# Patient Record
Sex: Male | Born: 1980
Health system: Southern US, Community
[De-identification: ages and names within clinical notes are randomized; demographics above are authoritative.]

## PROBLEM LIST (undated history)

## (undated) DIAGNOSIS — H60339 Swimmer's ear, unspecified ear: Secondary | ICD-10-CM

## (undated) DIAGNOSIS — B079 Viral wart, unspecified: Secondary | ICD-10-CM

## (undated) HISTORY — DX: Swimmer's ear, unspecified ear: H60.339

## (undated) HISTORY — DX: Viral wart, unspecified: B07.9

---

## 2007-11-05 ENCOUNTER — Ambulatory Visit: Payer: Self-pay | Admitting: Internal Medicine

## 2007-11-05 DIAGNOSIS — B079 Viral wart, unspecified: Secondary | ICD-10-CM | POA: Insufficient documentation

## 2010-03-22 ENCOUNTER — Ambulatory Visit: Payer: Self-pay | Admitting: Family Medicine

## 2010-03-22 DIAGNOSIS — H60339 Swimmer's ear, unspecified ear: Secondary | ICD-10-CM | POA: Insufficient documentation

## 2010-08-07 NOTE — Assessment & Plan Note (Signed)
Summary: ? RIGHT EAR INFECTION   Vital Signs:  Patient profile:   30 year old male Height:      75 inches Weight:      203 pounds BMI:     25.46 Temp:     98.4 degrees F oral Pulse rate:   76 / minute Pulse rhythm:   regular BP sitting:   112 / 72  (left arm) Cuff size:   regular  Vitals Entered By: Linde Gillis CMA Duncan Dull) (March 22, 2010 12:18 PM) CC: ? ear infection   History of Present Illness: 31 yo here with ? ear infection. Went swimming over the weekend, next day, right ear was swollen, very painful to touch. Could not wear his helmet at work. No fevers, chills, runny nose, hearing loss, or discharge from his ear.  Current Medications (verified): 1)  Ciprodex 0.3-0.1 % Susp (Ciprofloxacin-Dexamethasone) .... 4 Drops Into Affected Ear Two Times A Day X 7 Days.  Allergies: 1)  ! Penicillin  Past History:  Past Medical History: Last updated: 11/05/2007 Unremarkable  Past Surgical History: Last updated: 11/05/2007 Denies surgical history  Family History: Last updated: 11/05/2007 Parents healthy 1 sister No CAD, DM, HTN No colon cancer or prostate cancer  Social History: Last updated: 11/05/2007 Seperated  ~6/08 New relationship 2 children Occupation: Biochemist, clinical Current Smoker Alcohol use-occ  Risk Factors: Smoking Status: current (11/05/2007)  Review of Systems      See HPI General:  Denies chills and fever. ENT:  Complains of ear discharge and earache; denies decreased hearing, nasal congestion, sinus pressure, and sore throat. Resp:  Denies cough.  Physical Exam  General:  alert and normal appearance.   Ears:  L ear normal, R pinna tender, R tragus tender, and R canal inflamed.   Nose:  External nasal examination shows no deformity or inflammation. Nasal mucosa are pink and moist without lesions or exudates. Mouth:  Oral mucosa and oropharynx without lesions or exudates.  Teeth in good repair. Psych:  Cognition and judgment  appear intact. Alert and cooperative with normal attention span and concentration. No apparent delusions, illusions, hallucinations   Impression & Recommendations:  Problem # 1:  OTITIS EXTERNA, ACUTE, RIGHT (ICD-380.12) Assessment New Ciprodex x 1 week. Advised trying to keep ears dry while swimming. Continue alleve for pain and inflammation. His updated medication list for this problem includes:    Ciprodex 0.3-0.1 % Susp (Ciprofloxacin-dexamethasone) .Marland KitchenMarland KitchenMarland KitchenMarland Kitchen 4 drops into affected ear two times a day x 7 days.  Complete Medication List: 1)  Ciprodex 0.3-0.1 % Susp (Ciprofloxacin-dexamethasone) .... 4 drops into affected ear two times a day x 7 days. Prescriptions: CIPRODEX 0.3-0.1 % SUSP (CIPROFLOXACIN-DEXAMETHASONE) 4 drops into affected ear two times a day x 7 days.  #1 x 0   Entered and Authorized by:   Ruthe Mannan MD   Signed by:   Ruthe Mannan MD on 03/22/2010   Method used:   Electronically to        [facility]  [address] (retail)       [address]       [place], [place]  [address]       Ph: [phone] or [phone]       Fax: [phone]   RxID:   [id number]   Prior Medications (reviewed today): None Current Allergies (reviewed today): ! PENICILLIN

## 2011-01-15 ENCOUNTER — Emergency Department (HOSPITAL_COMMUNITY): Payer: 59

## 2011-01-15 ENCOUNTER — Emergency Department (HOSPITAL_COMMUNITY)
Admission: EM | Admit: 2011-01-15 | Discharge: 2011-01-16 | Disposition: A | Discharge status: Home / Self Care | Payer: 59 | Attending: Emergency Medicine | Admitting: Emergency Medicine

## 2011-01-15 DIAGNOSIS — IMO0002 Reserved for concepts with insufficient information to code with codable children: Secondary | ICD-10-CM | POA: Insufficient documentation

## 2011-01-15 DIAGNOSIS — Y9352 Activity, horseback riding: Secondary | ICD-10-CM | POA: Insufficient documentation

## 2013-02-10 ENCOUNTER — Ambulatory Visit (INDEPENDENT_AMBULATORY_CARE_PROVIDER_SITE_OTHER): Payer: 59 | Admitting: Family Medicine

## 2013-02-10 ENCOUNTER — Encounter: Payer: Self-pay | Admitting: Family Medicine

## 2013-02-10 VITALS — BP 118/90 | HR 65 | Temp 98.2°F | Wt 208.0 lb

## 2013-02-10 DIAGNOSIS — H60339 Swimmer's ear, unspecified ear: Secondary | ICD-10-CM

## 2013-02-10 DIAGNOSIS — H60331 Swimmer's ear, right ear: Secondary | ICD-10-CM

## 2013-02-10 MED ORDER — CIPROFLOXACIN-HYDROCORTISONE 0.2-1 % OT SUSP: 3.0000 [drp] | Freq: Two times a day (BID) | OTIC | Status: DC

## 2013-02-10 NOTE — Assessment & Plan Note (Signed)
Treat with topical antibiotics. Discussed otitis externa prevention.

## 2013-02-10 NOTE — Progress Notes (Signed)
  Subjective:    Patient ID: Greg Wong, male    DOB: 15-May-1981, 32 y.o.   MRN: [id number]  Otalgia  There is pain in the right ear. The current episode started in the past 7 days. The problem has been gradually worsening. There has been no fever (subjective fever two days ago). The pain is moderate. Associated symptoms include coughing, ear discharge, rhinorrhea and a sore throat. Pertinent negatives include no abdominal pain, diarrhea, drainage, headaches, hearing loss or rash. Associated symptoms comments: Nasal congestion. He has tried acetaminophen for the symptoms. The treatment provided mild relief. There is no history of a chronic ear infection, hearing loss or a tympanostomy tube.   Has ben swimming a lot. Recent trip to [place].   Review of Systems  HENT: Positive for ear pain, sore throat, rhinorrhea and ear discharge. Negative for hearing loss.   Respiratory: Positive for cough.   Gastrointestinal: Negative for abdominal pain and diarrhea.  Skin: Negative for rash.  Neurological: Negative for headaches.       Objective:   Physical Exam  Constitutional: Vital signs are normal. He appears well-developed and well-nourished.  Non-toxic appearance. He does not appear ill. No distress.  HENT:  Head: Normocephalic and atraumatic.  Right Ear: Hearing and tympanic membrane normal. No tenderness. No foreign bodies. Tympanic membrane is not retracted and not bulging.  Left Ear: Hearing, tympanic membrane, external ear and ear canal normal. No tenderness. No foreign bodies. Tympanic membrane is not retracted and not bulging.  Nose: Nose normal. No mucosal edema or rhinorrhea. Right sinus exhibits no maxillary sinus tenderness and no frontal sinus tenderness. Left sinus exhibits no maxillary sinus tenderness and no frontal sinus tenderness.  Mouth/Throat: Uvula is midline, oropharynx is clear and moist and mucous membranes are normal. Normal dentition. No dental caries. No  oropharyngeal exudate or tonsillar abscesses.  Right ext ear canal with redness and swelling. Pain with movement of pinna.  Eyes: Conjunctivae, EOM and lids are normal. Pupils are equal, round, and reactive to light. No foreign bodies found.  Neck: Trachea normal, normal range of motion and phonation normal. Neck supple. Carotid bruit is not present. No mass and no thyromegaly present.  Cardiovascular: Normal rate, regular rhythm, S1 normal, S2 normal, normal heart sounds, intact distal pulses and normal pulses.  Exam reveals no gallop.   No murmur heard. Pulmonary/Chest: Effort normal and breath sounds normal. No respiratory distress. He has no wheezes. He has no rhonchi. He has no rales.  Abdominal: Soft. Normal appearance and bowel sounds are normal. There is no hepatosplenomegaly. There is no tenderness. There is no rebound, no guarding and no CVA tenderness. No hernia.  Neurological: He is alert. He has normal reflexes.  Skin: Skin is warm, dry and intact. No rash noted.  Psychiatric: He has a normal mood and affect. His speech is normal and behavior is normal. Judgment normal.          Assessment & Plan:

## 2013-02-10 NOTE — Patient Instructions (Addendum)
Topical antibiotics drops  Twice daily in right ear x 7 days.

## 2013-04-09 ENCOUNTER — Telehealth: Payer: Self-pay | Admitting: Internal Medicine

## 2013-04-09 ENCOUNTER — Encounter: Payer: Self-pay | Admitting: Internal Medicine

## 2013-04-09 ENCOUNTER — Ambulatory Visit (INDEPENDENT_AMBULATORY_CARE_PROVIDER_SITE_OTHER): Payer: 59 | Admitting: Internal Medicine

## 2013-04-09 ENCOUNTER — Ambulatory Visit (INDEPENDENT_AMBULATORY_CARE_PROVIDER_SITE_OTHER)
Admission: RE | Admit: 2013-04-09 | Discharge: 2013-04-09 | Disposition: A | Discharge status: Home / Self Care | Payer: 59 | Source: Ambulatory Visit | Attending: Internal Medicine | Admitting: Internal Medicine

## 2013-04-09 VITALS — BP 150/90 | HR 54 | Temp 97.4°F | Wt 202.2 lb

## 2013-04-09 DIAGNOSIS — R079 Chest pain, unspecified: Secondary | ICD-10-CM

## 2013-04-09 DIAGNOSIS — R0781 Pleurodynia: Secondary | ICD-10-CM

## 2013-04-09 MED ORDER — TRAMADOL HCL 50 MG PO TABS: 50.0000 mg | ORAL_TABLET | Freq: Three times a day (TID) | ORAL | Status: DC | PRN

## 2013-04-09 NOTE — Telephone Encounter (Signed)
Patient Information:  Caller Name: Greg Wong  Phone: [phone]  Patient: Greg, Wong  Gender: Male  DOB: 01-Sep-1980  Age: 32 Years  PCP: Kerby Nora (Family Practice)  Office Follow Up:  Does the office need to follow up with this patient?: No  Instructions For The Office: N/A  RN Note:  Patient states he fell off his horse 03/30/13, landing on his chest, and believes he may have broken a rib on the lower left side.  States it hurts to inhale.  No cough; afebrile.  Denies resp distress; emergent symptoms denied per chest injury protocol.  Patient wants to be seen today; no appts available at [facility] office.  Appt scheduled 1345 04/09/13 at [facility] with Ms. Sampson Si.  krs/can  Symptoms  Reason For Call & Symptoms: ? broken rib  Reviewed Health History In EMR: Yes  Reviewed Medications In EMR: Yes  Reviewed Allergies In EMR: Yes  Reviewed Surgeries / Procedures: Yes  Date of Onset of Symptoms: 03/30/2013  Guideline(s) Used:  Chest Injury  Disposition Per Guideline:   See Today in Office  Reason For Disposition Reached:   Patient wants to be seen  Advice Given:  N/A  Patient Will Follow Care Advice:  YES

## 2013-04-09 NOTE — Patient Instructions (Signed)
Rib Fracture  Your caregiver has diagnosed you as having a rib fracture (a break). This can occur by a blow to the chest, by a fall against a hard object, or by violent coughing or sneezing. There may be one or many breaks. Rib fractures may heal on their own within 3 to 8 weeks. The longer healing period is usually associated with a continued cough or other aggravating activities.  HOME CARE INSTRUCTIONS    Avoid strenuous activity. Be careful during activities and avoid bumping the injured rib. Activities that cause pain pull on the fracture site(s) and are best avoided if possible.   Eat a normal, well-balanced diet. Drink plenty of fluids to avoid constipation.   Take deep breaths several times a day to keep lungs free of infection. Try to cough several times a day, splinting the injured area with a pillow. This will help prevent pneumonia.   Do not wear a rib belt or binder. These restrict breathing which can lead to pneumonia.   Only take over-the-counter or prescription medicines for pain, discomfort, or fever as directed by your caregiver.  SEEK MEDICAL CARE IF:   You develop a continual cough, associated with thick or bloody sputum.  SEEK IMMEDIATE MEDICAL CARE IF:    You have a fever.   You have difficulty breathing.   You have nausea (feeling sick to your stomach), vomiting, or abdominal (belly) pain.   You have worsening pain, not controlled with medications.  Document Released: 06/24/2005 Document Revised: 09/16/2011 Document Reviewed: 11/26/2006  ExitCare Patient Information 2014 [employer].

## 2013-04-09 NOTE — Progress Notes (Signed)
Subjective:    Patient ID: Greg Wong, male    DOB: 1980-10-07, 32 y.o.   MRN: [id number]  HPI  Pt presents to the clinic today with c/o pain in his left ribs. This started about 10 days ago when he fell off his horse. The pain seems to be worse when he takes a deep breath. 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 This is making the pain worse. He has not noticed any bruising but the are is very tender to touch. He is taking tylenol and advil for the pain but it is not helping. He did not hit his head. He denies blood in his urine, or stool.  Review of Systems  Past Medical History  Diagnosis Date  . Acute swimmers' ear   . Viral warts, unspecified     Current Outpatient Prescriptions  Medication Sig Dispense Refill  . ciprofloxacin-hydrocortisone (CIPRO HC OTIC) otic suspension Place 3 drops into the right ear 2 (two) times daily.  10 mL  0   No current facility-administered medications for this visit.    Allergies  Allergen Reactions  . Penicillins     Family History  Problem Relation Age of Onset  . CAD Neg Hx   . Diabetes Neg Hx   . Hypertension Neg Hx   . Cancer Neg Hx     no colon or protate cancer    History   Social History  . Marital Status: Single    Spouse Name: N/A    Number of Children: 2  . Years of Education: N/A   Occupational History  . detention officer    Social History Main Topics  . Smoking status: Never Smoker   . Smokeless tobacco: Not on file  . Alcohol Use: Not on file  . Drug Use: Not on file  . Sexual Activity: Not on file   Other Topics Concern  . Not on file   Social History Narrative  . No narrative on file     Constitutional: Denies fever, malaise, fatigue, headache or abrupt weight changes.  Respiratory: Denies difficulty breathing, shortness of breath, cough or sputum production.   Cardiovascular: Denies chest pain, chest tightness, palpitations or swelling in the hands or feet.   Musculoskeletal: Pt reports pain in left side of ribs. Denies decrease in range of motion, difficulty with gait, muscle pain or joint pain and swelling.  Skin: Denies redness, rashes, lesions or ulcercations.    No other specific complaints in a complete review of systems (except as listed in HPI above).     Objective:   Physical Exam  BP 150/90  Pulse 54  Temp(Src) 97.4 F (36.3 C) (Oral)  Wt 202 lb 4 oz (91.74 kg)  BMI 25.28 kg/m2  SpO2 99% Wt Readings from Last 3 Encounters:  04/09/13 202 lb 4 oz (91.74 kg)  02/10/13 208 lb (94.348 kg)  03/22/10 203 lb (92.08 kg)    General: Appears his stated age, well developed, well nourished in NAD. Skin: Warm, dry and intact. No rashes, lesions or ulcerations noted. Cardiovascular: Normal rate and rhythm. S1,S2 noted.  No murmur, rubs or gallops noted. No JVD or BLE edema. No carotid bruits noted. Pulmonary/Chest: Normal effort and positive vesicular breath sounds. No respiratory distress. No wheezes, rales or ronchi noted.  Musculoskeletal: Normal range of motion. No signs of joint swelling. No difficulty with gait. Tender to palpation at the left lower ribs.  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

## 2013-04-09 NOTE — Telephone Encounter (Signed)
Thanks for seeing him! 

## 2014-09-02 ENCOUNTER — Ambulatory Visit (INDEPENDENT_AMBULATORY_CARE_PROVIDER_SITE_OTHER): Payer: 59 | Admitting: Internal Medicine

## 2014-09-02 ENCOUNTER — Encounter: Payer: Self-pay | Admitting: Internal Medicine

## 2014-09-02 VITALS — BP 128/80 | HR 50 | Temp 98.2°F | Wt 200.8 lb

## 2014-09-02 DIAGNOSIS — B001 Herpesviral vesicular dermatitis: Secondary | ICD-10-CM

## 2014-09-02 MED ORDER — VALACYCLOVIR HCL 1 G PO TABS: 2000.0000 mg | ORAL_TABLET | Freq: Once | ORAL | Status: DC

## 2014-09-02 NOTE — Progress Notes (Signed)
Pre visit review using our clinic review tool, if applicable. No additional management support is needed unless otherwise documented below in the visit note. 

## 2014-09-02 NOTE — Assessment & Plan Note (Signed)
Really annoying now and has pain Will Rx valacyclovir

## 2014-09-02 NOTE — Progress Notes (Signed)
   Subjective:    Patient ID: Greg Wong, male    DOB: [birth year], 34 y.o.   MRN: [id number]  HPI Having problems with recurrent cold sores Goes back at least 4 years Used to be twice a year---now more frequent OTC meds not helping  No stress No new products Interested in more definitive treatement  No current outpatient prescriptions on file prior to visit.   No current facility-administered medications on file prior to visit.    Allergies  Allergen Reactions  . Penicillins     No past medical history on file.  No past surgical history on file.  Family History  Problem Relation Age of Onset  . CAD Neg Hx   . Diabetes Neg Hx   . Hypertension Neg Hx   . Cancer Neg Hx     no colon or protate cancer    History   Social History  . Marital Status: Single    Spouse Name: N/A  . Number of Children: 2  . 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 Smoking status: Never Smoker   . Smokeless tobacco: Never Used  . Alcohol Use: 0.0 oz/week    0 Standard drinks or equivalent per week  . Drug Use: No  . Sexual Activity: Not on file   Other Topics Concern  . Not on file   Social History Narrative   Has 2 children from prior relationship. Shared custody   engaged   Review of Systems  No fever No ear pain No rash     Objective:   Physical Exam  Constitutional: He appears well-developed and well-nourished. No distress.  HENT:  Healing cold sore on lower lip No other lesions  Neck: Normal range of motion. Neck supple.  Lymphadenopathy:    He has no cervical adenopathy.          Assessment & Plan:

## 2015-10-31 ENCOUNTER — Encounter: Payer: Self-pay | Admitting: Internal Medicine

## 2015-10-31 ENCOUNTER — Ambulatory Visit (INDEPENDENT_AMBULATORY_CARE_PROVIDER_SITE_OTHER): Payer: 59 | Admitting: Internal Medicine

## 2015-10-31 VITALS — BP 116/82 | HR 60 | Temp 97.5°F | Wt 219.0 lb

## 2015-10-31 DIAGNOSIS — H6122 Impacted cerumen, left ear: Secondary | ICD-10-CM

## 2015-10-31 DIAGNOSIS — H6120 Impacted cerumen, unspecified ear: Secondary | ICD-10-CM | POA: Insufficient documentation

## 2015-10-31 NOTE — Progress Notes (Signed)
   Subjective:    Patient ID: Greg Wong, male    DOB: 05-Mar-1981, 35 y.o.   MRN: [id number]  HPI Here due to ear pain  Had infection in left ear about 2 months ago Still feels plugged up and abnormal Had sinus infection also Let it run its course  Hearing is still muffled No fever No cough Feels over the illness  No Rx  No current outpatient prescriptions on file prior to visit.   No current facility-administered medications on file prior to visit.    Allergies  Allergen Reactions  . Penicillins     No past medical history on file.  No past surgical history on file.  Family History  Problem Relation Age of Onset  . CAD Neg Hx   . Diabetes Neg Hx   . Hypertension Neg Hx   . Cancer Neg Hx     no colon or protate cancer    Social History   Social History  . Marital Status: Married    Spouse Name: N/A  . Number of Children: 2  . 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 Smoking status: Former Research scientist (life sciences)  . Smokeless tobacco: Never Used  . Alcohol Use: 0.0 oz/week    0 Standard drinks or equivalent per week  . Drug Use: No  . Sexual Activity: Not on file   Other Topics Concern  . Not on file   Social History Narrative   Has 2 children from prior relationship. Shared custody      Review of Systems  Sleeps okay Has been working to gain some weight--- weights and cardio No allergy problems     Objective:   Physical Exam  Constitutional: He appears well-developed and well-nourished. No distress.  HENT:  Mouth/Throat: Oropharynx is clear and moist. No oropharyngeal exudate.  Right TM and canal normal No nasal inflammation Left TM obscured with cerumen in canal  Neck: Normal range of motion. Neck supple. No thyromegaly present.          Assessment & Plan:

## 2015-10-31 NOTE — Assessment & Plan Note (Signed)
Lavage cleared the cerumen and his symptoms Discussed home Rx in the future  Also checked moles on his back--nothing worrisome (some small 63mm nevi)

## 2015-10-31 NOTE — Progress Notes (Signed)
Pre visit review using our clinic review tool, if applicable. No additional management support is needed unless otherwise documented below in the visit note. 

## 2016-05-16 ENCOUNTER — Encounter: Payer: Self-pay | Admitting: Internal Medicine

## 2016-05-16 MED ORDER — VALACYCLOVIR HCL 1 G PO TABS: 2000.0000 mg | ORAL_TABLET | Freq: Once | ORAL | 0 refills | Status: DC

## 2016-10-10 ENCOUNTER — Emergency Department
Admission: EM | Admit: 2016-10-10 | Discharge: 2016-10-10 | Disposition: A | Discharge status: Home / Self Care | Payer: 59 | Attending: Emergency Medicine | Admitting: Emergency Medicine

## 2016-10-10 DIAGNOSIS — Z23 Encounter for immunization: Secondary | ICD-10-CM | POA: Insufficient documentation

## 2016-10-10 DIAGNOSIS — Y9389 Activity, other specified: Secondary | ICD-10-CM | POA: Insufficient documentation

## 2016-10-10 DIAGNOSIS — Y999 Unspecified external cause status: Secondary | ICD-10-CM | POA: Insufficient documentation

## 2016-10-10 DIAGNOSIS — S6992XA Unspecified injury of left wrist, hand and finger(s), initial encounter: Secondary | ICD-10-CM | POA: Diagnosis present

## 2016-10-10 DIAGNOSIS — W268XXA Contact with other sharp object(s), not elsewhere classified, initial encounter: Secondary | ICD-10-CM | POA: Diagnosis not present

## 2016-10-10 DIAGNOSIS — Z87891 Personal history of nicotine dependence: Secondary | ICD-10-CM | POA: Diagnosis not present

## 2016-10-10 DIAGNOSIS — Y929 Unspecified place or not applicable: Secondary | ICD-10-CM | POA: Diagnosis not present

## 2016-10-10 DIAGNOSIS — S61412A Laceration without foreign body of left hand, initial encounter: Secondary | ICD-10-CM

## 2016-10-10 MED ORDER — TETANUS-DIPHTH-ACELL PERTUSSIS 5-2.5-18.5 LF-MCG/0.5 IM SUSP
0.5000 mL | Freq: Once | INTRAMUSCULAR | Status: AC
  Administered 2016-10-10: 0.5 mL via INTRAMUSCULAR
  Filled 2016-10-10: qty 0.5

## 2016-10-10 MED ORDER — BACITRACIN ZINC 500 UNIT/GM EX OINT
TOPICAL_OINTMENT | Freq: Once | CUTANEOUS | Status: AC
  Administered 2016-10-10: 1 via TOPICAL
  Filled 2016-10-10: qty 0.9

## 2016-10-10 MED ORDER — LIDOCAINE HCL (PF) 1 % IJ SOLN
INTRAMUSCULAR | Status: AC
  Filled 2016-10-10: qty 5

## 2016-10-10 NOTE — ED Triage Notes (Signed)
Cut posterior side of left hand with sheet metal.  Bleeding controlled.

## 2016-10-10 NOTE — ED Notes (Signed)
See triage note  Laceration from sheet metal to left hand

## 2016-10-10 NOTE — ED Provider Notes (Signed)
[facility] Emergency Department Provider Note  ____________________________________________  Time seen: Approximately 4:24 PM  I have reviewed the triage vital signs and the nursing notes.   HISTORY  Chief Complaint Laceration    HPI Greg Wong is a 36 y.o. male that presents to the emergency department with left hand laceration. Patient states he was working on a tin roof and a piece of metal hit his hand. No additional injuries. Last tetanus shot was 5 years ago. No headache, SOB, CP, nausea, vomiting, abdominal pain.    No past medical history on file.  Patient Active Problem List   Diagnosis Date Noted  . Cerumen impaction 10/31/2015  . 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  Prior to Admission medications   Medication Sig Start Date End Date Taking? Authorizing Provider  Omega-3 Fatty Acids (FISH OIL) 1000 MG CAPS Take 1 capsule by mouth.    Historical Provider, MD    Allergies Penicillins  Family History  Problem Relation Age of Onset  . CAD Neg Hx   . Diabetes Neg Hx   . Hypertension Neg Hx   . Cancer Neg Hx     no colon or protate cancer    Social History Social History  Substance Use Topics  . Smoking status: Former Research scientist (life sciences)  . Smokeless tobacco: Never Used  . Alcohol use 0.0 oz/week     Review of Systems  Constitutional: No fever/chills Cardiovascular: No chest pain. Respiratory: No SOB. Gastrointestinal: No abdominal pain.  No nausea, no vomiting.  Genitourinary: Negative for dysuria. Musculoskeletal: Negative for musculoskeletal pain. Skin: Negative for rash, ecchymosis. Neurological: Negative for headaches, numbness or tingling   ____________________________________________   PHYSICAL EXAM:  VITAL SIGNS: ED Triage Vitals  Enc Vitals Group     BP --      Pulse --      Resp --      Temp --      Temp src --      SpO2 --      Weight 10/10/16 1455 210 lb (95.3 kg)   Height 10/10/16 1455 6\' 4"  (1.93 m)     Head Circumference --      Peak Flow --      Pain Score 10/10/16 1454 1     Pain Loc --      Pain Edu? --      Excl. in [place]? --      Constitutional: Alert and oriented. Well appearing and in no acute distress. Eyes: Conjunctivae are normal. PERRL. EOMI. Head: Atraumatic. ENT:      Ears:      Nose: No congestion/rhinnorhea.      Mouth/Throat: Mucous membranes are moist.  Neck: No stridor.   Cardiovascular: Normal rate, regular rhythm.  Good peripheral circulation. Respiratory: Normal respiratory effort without tachypnea or retractions. Lungs CTAB. Good air entry to the bases with no decreased or absent breath sounds. Musculoskeletal: Full range of motion to all extremities. No gross deformities appreciated. Strength 5 out of 5 in fingers. Neurologic:  Normal speech and language. No gross focal neurologic deficits are appreciated.  Skin:  Skin is warm, dry. No rash noted. 1.25 cm shallow laceration on dorsal side of left hand.   ____________________________________________   LABS (all labs ordered are listed, but only abnormal results are displayed)  Labs Reviewed - No data to display ____________________________________________  EKG   ____________________________________________  RADIOLOGY  No results found.  ____________________________________________  PROCEDURES  Procedure(s) performed:    Procedures  LACERATION REPAIR Performed by: Eliezer Lofts PA-S  Consent: Verbal consent obtained.  Consent given by: patient  Prepped and Draped in normal sterile fashion  Wound explored: No foreign bodies   Laceration Location: left hand  Laceration Length: 1.25 cm  Anesthesia: None  Local anesthetic: lidocaine 1% without epinephrine  Anesthetic total: 2 ml  Irrigation method: syringe  Amount of cleaning: 536ml normal saline  Skin closure: 4-0 nylon  Number of sutures: 4  Technique: Simple interrupted  Patient  tolerance: Patient tolerated the procedure well with no immediate complications.  Medications  lidocaine (PF) (XYLOCAINE) 1 % injection (not administered)  Tdap (BOOSTRIX) injection 0.5 mL (not administered)  bacitracin ointment (not administered)     ____________________________________________   INITIAL IMPRESSION / ASSESSMENT AND PLAN / ED COURSE  Pertinent labs & imaging results that were available during my care of the patient were reviewed by me and considered in my medical decision making (see chart for details).  Review of the [place] CSRS was performed in accordance of the [place] prior to dispensing any controlled drugs.     Patient's diagnosis is consistent with hand laceration. Vital signs and exam are reassuring. Laceration was repaired in ED by PA student Eliezer Lofts. Tetanus shot was updated. Patient will apply bacitracin ointment to laceration. Patient is to follow up with PCP as directed. Patient is given ED precautions to return to the ED for any worsening or new symptoms.     ____________________________________________  FINAL CLINICAL IMPRESSION(S) / ED DIAGNOSES  Final diagnoses:  Laceration of left hand, foreign body presence unspecified, initial encounter      NEW MEDICATIONS STARTED DURING THIS VISIT:  New Prescriptions   No medications on file        This chart was dictated using voice recognition software/Dragon. Despite best efforts to proofread, errors can occur which can change the meaning. Any change was purely unintentional.    Laban Emperor, PA-C 10/10/16 [place], PA-C 10/10/16 [place], MD 10/10/16 [phone]

## 2016-11-20 ENCOUNTER — Encounter: Payer: Self-pay | Admitting: Internal Medicine

## 2016-11-20 ENCOUNTER — Ambulatory Visit (INDEPENDENT_AMBULATORY_CARE_PROVIDER_SITE_OTHER): Payer: 59 | Admitting: Internal Medicine

## 2016-11-20 VITALS — BP 120/84 | HR 60 | Temp 98.0°F | Wt 211.0 lb

## 2016-11-20 DIAGNOSIS — B001 Herpesviral vesicular dermatitis: Secondary | ICD-10-CM | POA: Diagnosis not present

## 2016-11-20 MED ORDER — VALACYCLOVIR HCL 1 G PO TABS: 2000.0000 mg | ORAL_TABLET | Freq: Once | ORAL | 5 refills | Status: DC

## 2016-11-20 NOTE — Assessment & Plan Note (Signed)
Will renew the valtrex

## 2016-11-20 NOTE — Progress Notes (Signed)
   Subjective:    Patient ID: Greg Wong, male    DOB: 1981/01/01, 36 y.o.   MRN: [id number]  HPI Here due to recurrent cold sores  Has them more in the winter--not as much in summer Average 1 per month Has been using the valtrex prn--just ran out  Current Outpatient Prescriptions on File Prior to Visit  Medication Sig Dispense Refill  . Omega-3 Fatty Acids (FISH OIL) 1000 MG CAPS Take 1 capsule by mouth.     No current facility-administered medications on file prior to visit.     Allergies  Allergen Reactions  . Penicillins     No past medical history on file.  No past surgical history on file.  Family History  Problem Relation Age of Onset  . CAD Neg Hx   . Diabetes Neg Hx   . Hypertension Neg Hx   . Cancer Neg Hx        no colon or protate cancer    Social History   Social History  . Marital status: Married    Spouse name: N/A  . Number of children: 2  . 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 Smokeless tobacco: Never Used  . Alcohol use 0.0 oz/week  . Drug use: No  . Sexual activity: Not on file   Other Topics Concern  . Not on file   Social History Narrative   Has 2 children from prior relationship. Shared custody      Review of Systems  Sleeps well Appetite is good Weight is stable     Objective:   Physical Exam  Constitutional: He appears well-nourished. No distress.  HENT:  Mouth/Throat: Oropharynx is clear and moist. No oropharyngeal exudate.  No oral lesions          Assessment & Plan:

## 2018-05-25 ENCOUNTER — Encounter: Payer: Self-pay | Admitting: Family Medicine

## 2018-05-25 ENCOUNTER — Ambulatory Visit: Payer: 59 | Admitting: Internal Medicine

## 2018-05-25 ENCOUNTER — Ambulatory Visit: Payer: 59 | Admitting: Family Medicine

## 2018-05-25 VITALS — BP 118/62 | HR 72 | Temp 98.5°F | Resp 10 | Ht 75.0 in | Wt 221.0 lb

## 2018-05-25 DIAGNOSIS — B349 Viral infection, unspecified: Secondary | ICD-10-CM

## 2018-05-25 NOTE — Patient Instructions (Signed)
Looks like you have a virus  1. Increase fluids -- water, tea 2. Cough - honey and lemon  3. Sore throat - ibuprofen and honey, salt water gargles 4. Congestion - Neti Pot 1-2 times a day, Flonase (store brand is fine) daily  If fever or chills and worsening symptoms or symptoms not improving over the next week

## 2018-05-25 NOTE — Progress Notes (Signed)
Subjective:     Greg Wong is a 37 y.o. male presenting for Sore Throat (worse. No fever. some chills.) and Emesis (Started Saturday 05/23/2018)     Sore Throat   This is a new problem. The current episode started in the past 7 days. The problem has been gradually worsening. Neither side of throat is experiencing more pain than the other. There has been no fever. The pain is at a severity of 6/10. The pain is moderate. Associated symptoms include congestion, coughing (brown sputum), ear pain (muffled), a hoarse voice, a plugged ear sensation, trouble swallowing and vomiting. Pertinent negatives include no abdominal pain, diarrhea, drooling, ear discharge, headaches, neck pain, shortness of breath or swollen glands. He has had no exposure to strep or mono. He has tried NSAIDs and gargles for the symptoms. The treatment provided mild relief.  Emesis   This is a new problem. The current episode started yesterday. The problem occurs less than 2 times per day. The problem has been unchanged. The emesis has an appearance of bright red blood. Associated symptoms include coughing (brown sputum). Pertinent negatives include no abdominal pain, diarrhea or headaches. Risk factors include ill contacts. He has tried increased fluids and diet change for the symptoms. The treatment provided no relief.   No alcohol Occasional Heartburn   Review of Systems  HENT: Positive for congestion, ear pain (muffled), hoarse voice and trouble swallowing. Negative for drooling and ear discharge.   Respiratory: Positive for cough (brown sputum). Negative for shortness of breath.   Gastrointestinal: Positive for vomiting. Negative for abdominal pain and diarrhea.  Musculoskeletal: Negative for neck pain.  Neurological: Negative for headaches.     Social History   Tobacco Use  Smoking Status Former Smoker  Smokeless Tobacco Never Used        Objective:    BP Readings from Last 3 Encounters:  05/25/18  118/62  11/20/16 120/84  10/10/16 120/61   Wt Readings from Last 3 Encounters:  05/25/18 221 lb (100.2 kg)  11/20/16 211 lb (95.7 kg)  10/10/16 210 lb (95.3 kg)    BP 118/62   Pulse 72   Temp 98.5 F (36.9 C)   Resp 10   Ht 6\' 3"  (1.905 m)   Wt 221 lb (100.2 kg)   SpO2 97%   BMI 27.62 kg/m    Physical Exam  Constitutional: He appears well-developed and well-nourished. He does not appear ill. No distress.  HENT:  Head: Normocephalic and atraumatic.  Right Ear: Tympanic membrane and ear canal normal.  Left Ear: Tympanic membrane and ear canal normal.  Mouth/Throat: Uvula is midline and mucous membranes are normal. Posterior oropharyngeal erythema present. No oropharyngeal exudate or posterior oropharyngeal edema. Tonsils are 0 on the right. Tonsils are 0 on the left.  Eyes: EOM are normal.  Neck: Neck supple.  Cardiovascular: Normal rate and regular rhythm.  No murmur heard. Pulmonary/Chest: Effort normal and breath sounds normal. No respiratory distress.  Abdominal: Soft. Bowel sounds are normal. He exhibits no distension. There is no tenderness. There is no guarding.  Lymphadenopathy:    He has no cervical adenopathy.  Neurological: He is alert.  Skin: Skin is warm and dry. Capillary refill takes less than 2 seconds.  Psychiatric: He has a normal mood and affect.          Assessment & Plan:   Problem List Items Addressed This Visit    None    Visit Diagnoses    Acute viral  syndrome    -  Primary     Patient Instructions  Looks like you have a virus  1. Increase fluids -- water, tea 2. Cough - honey and lemon  3. Sore throat - ibuprofen and honey, salt water gargles 4. Congestion - Neti Pot 1-2 times a day, Flonase (store brand is fine) daily  If fever or chills and worsening symptoms or symptoms not improving over the next week   Given no fever, exudates, swollen lymph nodes and timing of illness discussed strept and Flu but elected to not test today.  Consider further work up if symptoms persisting   Lesleigh Noe, MD

## 2019-11-25 ENCOUNTER — Telehealth: Payer: Self-pay | Admitting: Internal Medicine

## 2019-11-25 MED ORDER — VALACYCLOVIR HCL 1 G PO TABS: 2000.0000 mg | ORAL_TABLET | Freq: Once | ORAL | 0 refills | Status: DC

## 2019-11-25 NOTE — Telephone Encounter (Signed)
Spoke to pt. He will call back tomorrow when he has his calendar and schedule CPE.

## 2019-11-25 NOTE — Telephone Encounter (Signed)
Patient called and states that he is needing a refill of his Valtrex Pt was advised to call when he needed a new refill  Pharmacy [facility]

## 2019-11-25 NOTE — Telephone Encounter (Signed)
Please let him know that I did send in the refill. Since he hasn't been seen in a while, he should set up a PE within the next 6 months or so

## 2019-11-25 NOTE — Telephone Encounter (Signed)
Because it is not on his med list currently, Dr Silvio Pate will have to approve.

## 2019-12-01 ENCOUNTER — Telehealth: Payer: Self-pay

## 2019-12-01 NOTE — Telephone Encounter (Signed)
Pt left v/m that [facility] has not filled the valacyclovir 1000 mg refill. Refilled # 28 x 0 on 11/25/19. [facility] said [facility] before [facility] prescription; and pt wants to know status of additional info to [facility].

## 2019-12-01 NOTE — Telephone Encounter (Signed)
Spoke to pt. Advised him we have not received a fax or call from them so we do not know what they need. He has not spoken to anyone. Just receiving texts.

## 2019-12-27 ENCOUNTER — Telehealth: Payer: Self-pay

## 2019-12-27 NOTE — Telephone Encounter (Signed)
Pt said for 3 days has had rt earache that is constant and goes from dull to sharpe; is worse at nighttime;pain level at night 6-7 and pt request abx. Pt not seen since 05/25/2018. Pt said every time he swims he gets swimmers ear and pt was in [facility] 12/18/19 -12/22/19 doing a lot of swimming. No H/A or dizziness. Pt has no covid symptoms, and no known exposure to + covid. r Letvak not in office today and I asked Leafy Ro and Larene Beach RN and was advised if no other covid symptoms could schedule in office appt. No available appts today and pt needs to come in afternoon. Pt scheduled in office appt with R Baity NP on 12/28/19 at 2 PM; pt will ck in at front desk at 1:45. UC & Ed precautions given and pt voiced understanding.

## 2019-12-27 NOTE — Telephone Encounter (Signed)
Noted, will discuss at upcoming appt 

## 2019-12-28 ENCOUNTER — Ambulatory Visit: Payer: 59 | Admitting: Internal Medicine

## 2020-03-13 ENCOUNTER — Emergency Department: Payer: 59

## 2020-03-13 ENCOUNTER — Observation Stay
Admission: EM | Admit: 2020-03-13 | Discharge: 2020-03-14 | Disposition: A | Discharge status: Home / Self Care | Payer: 59 | Attending: General Surgery | Admitting: General Surgery

## 2020-03-13 ENCOUNTER — Encounter
Admission: EM | Disposition: A | Discharge status: Home / Self Care | Payer: Self-pay | Source: Home / Self Care | Attending: Emergency Medicine

## 2020-03-13 ENCOUNTER — Encounter: Payer: Self-pay | Admitting: Emergency Medicine

## 2020-03-13 ENCOUNTER — Observation Stay: Payer: 59 | Admitting: Anesthesiology

## 2020-03-13 ENCOUNTER — Other Ambulatory Visit: Payer: Self-pay

## 2020-03-13 DIAGNOSIS — Z87891 Personal history of nicotine dependence: Secondary | ICD-10-CM | POA: Diagnosis not present

## 2020-03-13 DIAGNOSIS — Z20822 Contact with and (suspected) exposure to covid-19: Secondary | ICD-10-CM | POA: Insufficient documentation

## 2020-03-13 DIAGNOSIS — K3580 Unspecified acute appendicitis: Principal | ICD-10-CM | POA: Diagnosis present

## 2020-03-13 DIAGNOSIS — R1031 Right lower quadrant pain: Secondary | ICD-10-CM | POA: Diagnosis present

## 2020-03-13 HISTORY — PX: XI ROBOTIC LAPAROSCOPIC ASSISTED APPENDECTOMY: SHX6877

## 2020-03-13 LAB — URINALYSIS, COMPLETE (UACMP) WITH MICROSCOPIC
Bacteria, UA: NONE SEEN
Bilirubin Urine: NEGATIVE
Glucose, UA: NEGATIVE mg/dL
Hgb urine dipstick: NEGATIVE
Ketones, ur: 5 mg/dL — AB
Leukocytes,Ua: NEGATIVE
Nitrite: NEGATIVE
Protein, ur: 30 mg/dL — AB
Specific Gravity, Urine: 1.018 (ref 1.005–1.030)
Squamous Epithelial / LPF: NONE SEEN (ref 0–5)
pH: 9 — ABNORMAL HIGH (ref 5.0–8.0)

## 2020-03-13 LAB — COMPREHENSIVE METABOLIC PANEL
ALT: 19 U/L (ref 0–44)
AST: 21 U/L (ref 15–41)
Albumin: 4.9 g/dL (ref 3.5–5.0)
Alkaline Phosphatase: 63 U/L (ref 38–126)
Anion gap: 10 (ref 5–15)
BUN: 11 mg/dL (ref 6–20)
CO2: 26 mmol/L (ref 22–32)
Calcium: 9.1 mg/dL (ref 8.9–10.3)
Chloride: 102 mmol/L (ref 98–111)
Creatinine, Ser: 1 mg/dL (ref 0.61–1.24)
GFR calc Af Amer: >60 mL/min (ref >60)
GFR calc non Af Amer: >60 mL/min (ref >60)
Glucose, Bld: 131 mg/dL — ABNORMAL HIGH (ref 70–99)
Potassium: 3.9 mmol/L (ref 3.5–5.1)
Sodium: 138 mmol/L (ref 135–145)
Total Bilirubin: 1 mg/dL (ref 0.3–1.2)
Total Protein: 7.3 g/dL (ref 6.5–8.1)

## 2020-03-13 LAB — CBC
HCT: 42.7 % (ref 39.0–52.0)
Hemoglobin: 15 g/dL (ref 13.0–17.0)
MCH: 29.8 pg (ref 26.0–34.0)
MCHC: 35.1 g/dL (ref 30.0–36.0)
MCV: 84.7 fL (ref 80.0–100.0)
Platelets: 166 10*3/uL (ref 150–400)
RBC: 5.04 MIL/uL (ref 4.22–5.81)
RDW: 12 % (ref 11.5–15.5)
WBC: 11.3 10*3/uL — ABNORMAL HIGH (ref 4.0–10.5)
nRBC: 0 % (ref 0.0–0.2)

## 2020-03-13 LAB — LIPASE, BLOOD: Lipase: 24 U/L (ref 11–51)

## 2020-03-13 LAB — SARS CORONAVIRUS 2 BY RT PCR (HOSPITAL ORDER, PERFORMED IN ~~LOC~~ HOSPITAL LAB): SARS Coronavirus 2: NEGATIVE

## 2020-03-13 IMAGING — CT CT ABD-PELV W/ CM
1 series · 13 of 38 positions shown, 16 images · IV contrast (APPLIED)
Comparison: None.

CLINICAL DATA: Acute right lower quadrant abdominal pain.

EXAM:
CT ABDOMEN AND PELVIS WITH CONTRAST
TECHNIQUE: Multidetector CT imaging of the abdomen and pelvis was performed
using the standard protocol following bolus administration of
intravenous contrast.
CONTRAST:  100mL OMNIPAQUE IOHEXOL 300 MG/ML  SOLN

[Series 5: coronal st · coronal · 0.79mm/px · 13 of 94 slices shown, 16 images]
[im 4/94  lung]
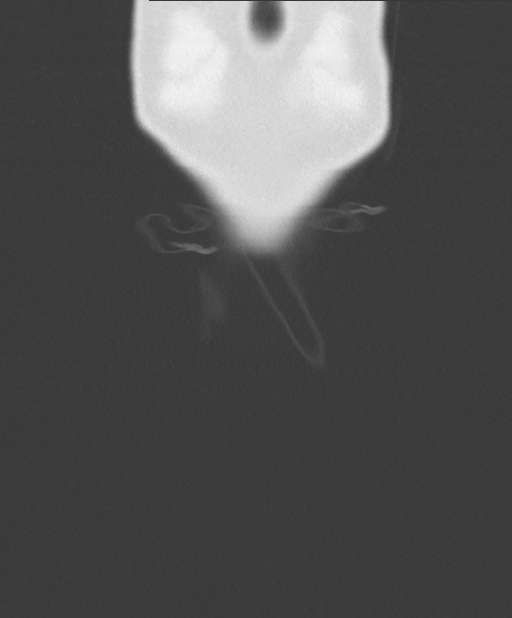
[im 7/94  soft-tissue]
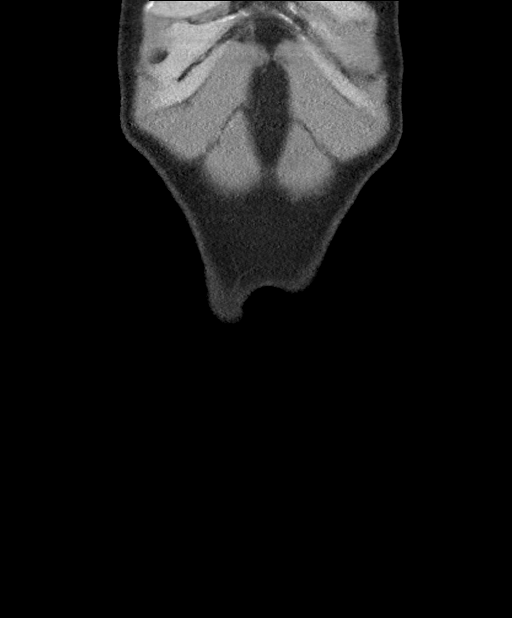
[im 7/94  lung]
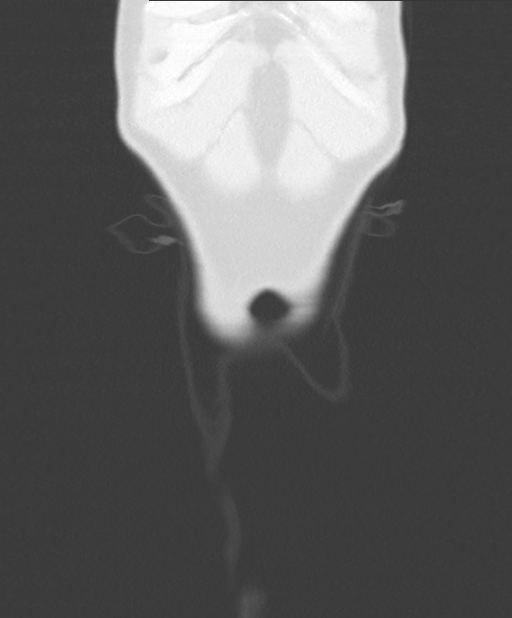
[im 7/94  bone]
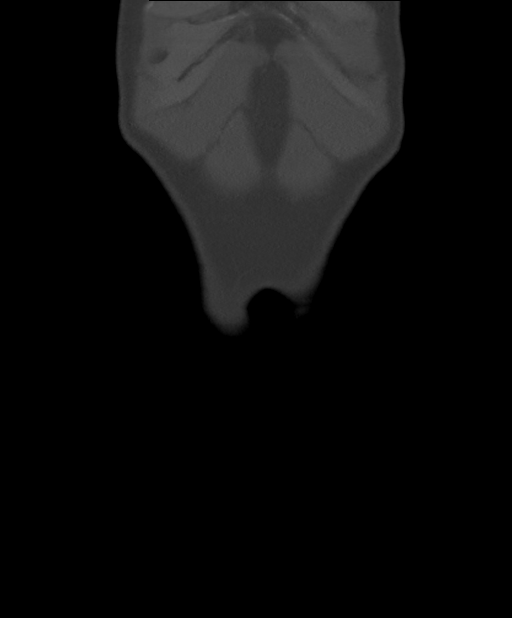
[im 10/94  lung]
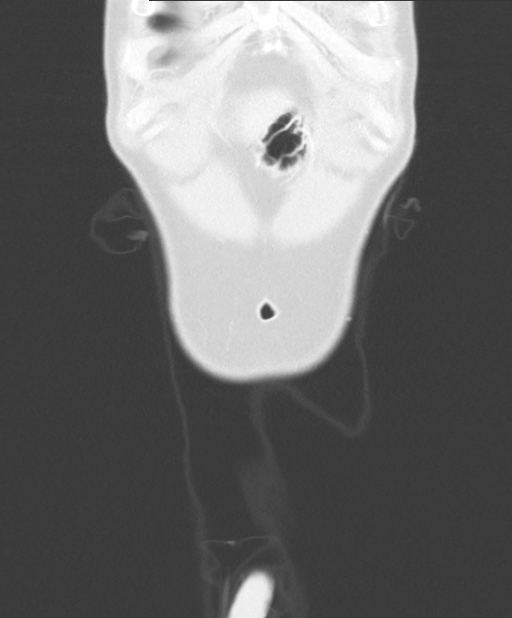
[im 13/94  lung]
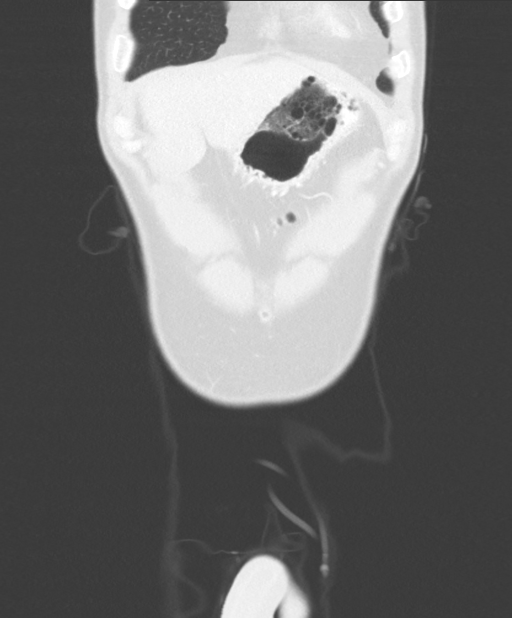
[im 16/94  soft-tissue]
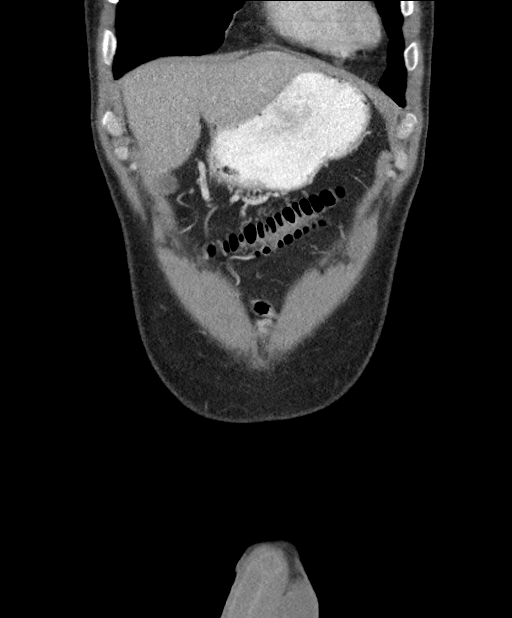
[im 25/94  soft-tissue]
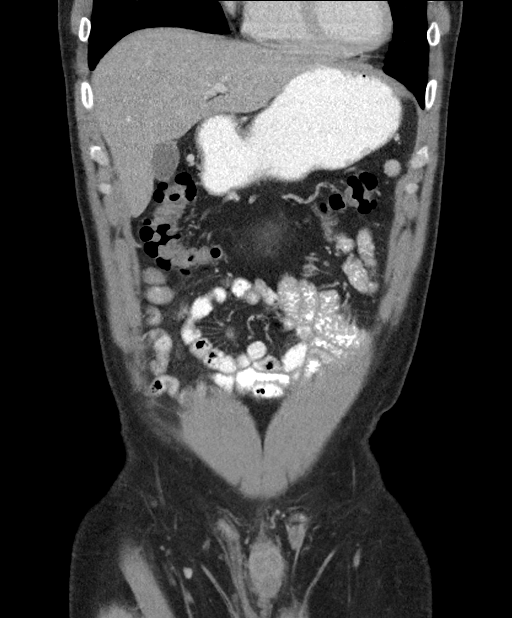
[im 32/94  soft-tissue]
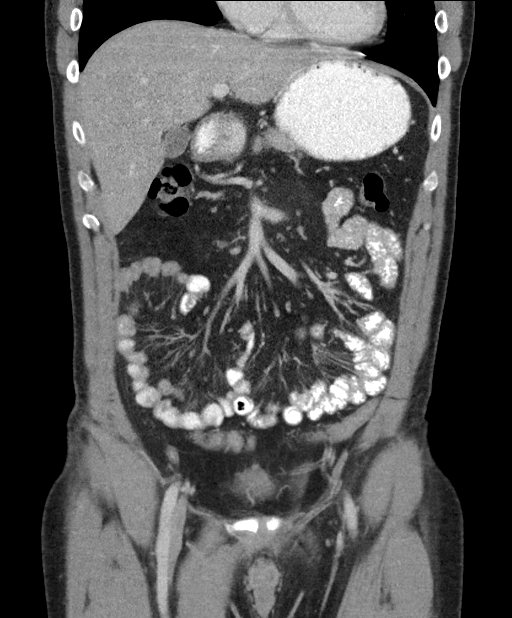
[im 42/94  soft-tissue]
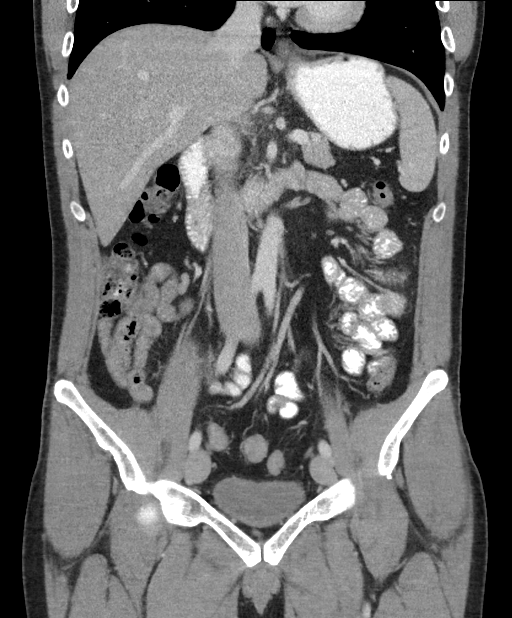
[im 49/94  soft-tissue]
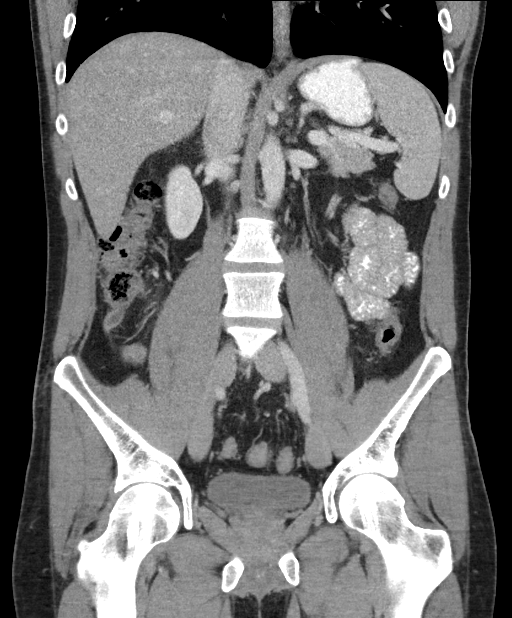
[im 61/94  soft-tissue]
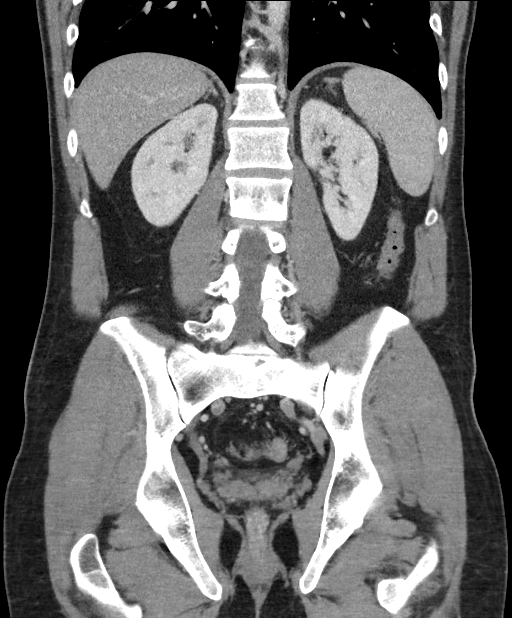
[im 70/94  soft-tissue]
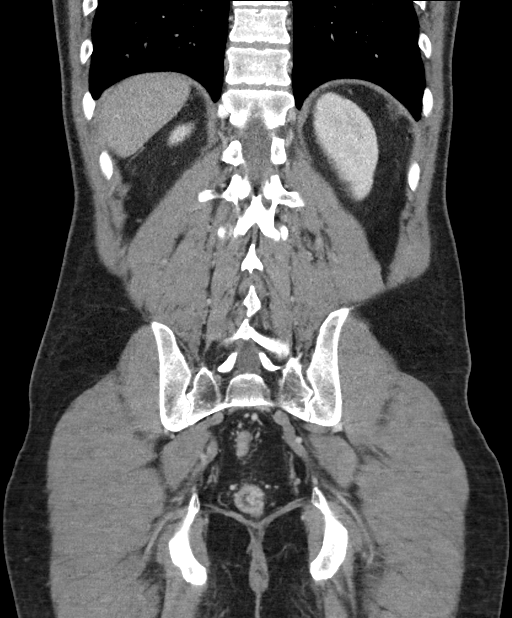
[im 79/94  soft-tissue]
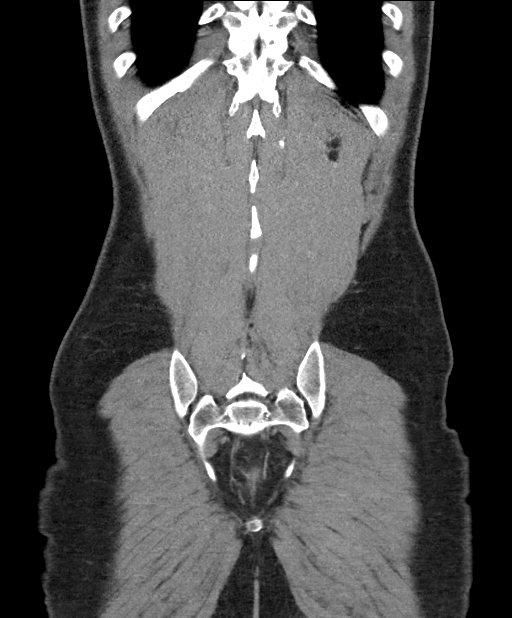
[im 79/94  bone]
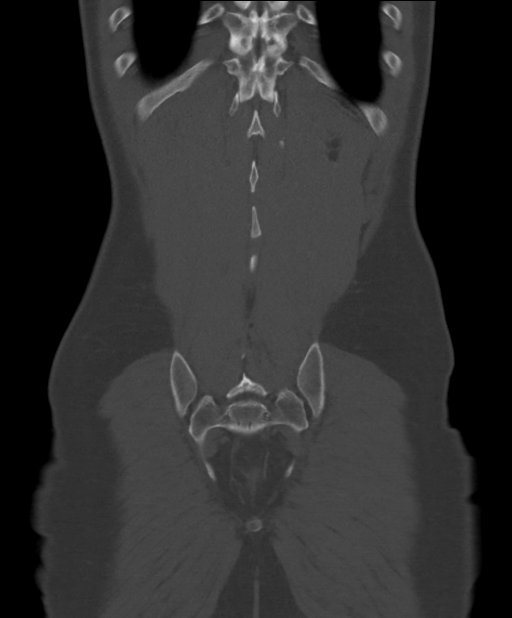
[im 88/94  soft-tissue]
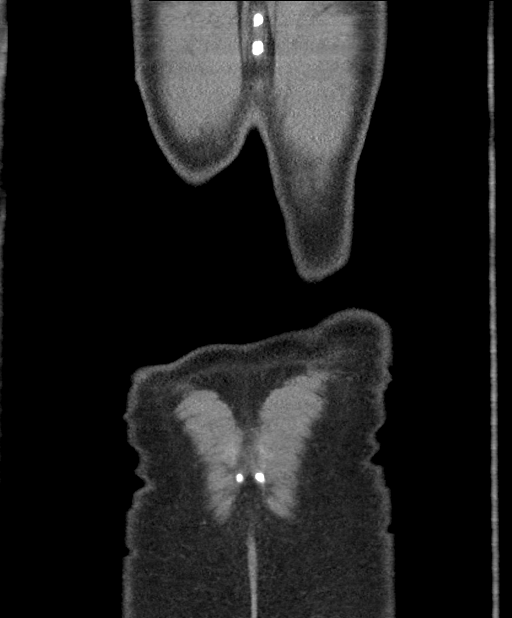

[13 of 38 positions shown; findings below may reference images not displayed]

FINDINGS: Lower chest: No acute abnormality.

Hepatobiliary: No focal liver abnormality is seen. No gallstones,
gallbladder wall thickening, or biliary dilatation.

Pancreas: Unremarkable. No pancreatic ductal dilatation or
surrounding inflammatory changes.

Spleen: Normal in size without focal abnormality.

Adrenals/Urinary Tract: Adrenal glands are unremarkable. Kidneys are
normal, without renal calculi, focal lesion, or hydronephrosis.
Bladder is unremarkable.

Stomach/Bowel: The stomach appears normal. There is no evidence of
bowel obstruction. The appendix is enlarged with surrounding
inflammation consistent with acute appendicitis.

Appendix: Location: Right lower quadrant.

Diameter: 13 mm.

Appendicolith: No.

Mucosal hyper-enhancement: Yes.

Extraluminal gas: No.

Periappendiceal collection: No.

Vascular/Lymphatic: No significant vascular findings are present. No
enlarged abdominal or pelvic lymph nodes.

Reproductive: Prostate is unremarkable.

Other: No abdominal wall hernia or abnormality. No abdominopelvic
ascites.

Musculoskeletal: No acute or significant osseous findings.
IMPRESSION: Findings consistent with acute appendicitis. No abscess is noted.

## 2020-03-13 SURGERY — APPENDECTOMY, ROBOT-ASSISTED, LAPAROSCOPIC
Anesthesia: General | Site: Abdomen

## 2020-03-13 MED ORDER — LIDOCAINE HCL (PF) 2 % IJ SOLN
INTRAMUSCULAR | Status: AC
  Filled 2020-03-13: qty 5

## 2020-03-13 MED ORDER — FENTANYL CITRATE (PF) 100 MCG/2ML IJ SOLN
INTRAMUSCULAR | Status: AC
  Filled 2020-03-13: qty 2

## 2020-03-13 MED ORDER — FENTANYL CITRATE (PF) 100 MCG/2ML IJ SOLN: 25.0000 ug | INTRAMUSCULAR | Status: DC | PRN

## 2020-03-13 MED ORDER — SUGAMMADEX SODIUM 200 MG/2ML IV SOLN
INTRAVENOUS | Status: DC | PRN
  Administered 2020-03-13: 200 mg via INTRAVENOUS

## 2020-03-13 MED ORDER — IOHEXOL 300 MG/ML  SOLN
100.0000 mL | Freq: Once | INTRAMUSCULAR | Status: AC | PRN
  Administered 2020-03-13: 100 mL via INTRAVENOUS

## 2020-03-13 MED ORDER — BUPIVACAINE HCL (PF) 0.5 % IJ SOLN
INTRAMUSCULAR | Status: DC | PRN
  Administered 2020-03-13: 20 mL

## 2020-03-13 MED ORDER — PROMETHAZINE HCL 25 MG/ML IJ SOLN: 6.2500 mg | INTRAMUSCULAR | Status: DC | PRN

## 2020-03-13 MED ORDER — METRONIDAZOLE IN NACL 5-0.79 MG/ML-% IV SOLN
500.0000 mg | Freq: Three times a day (TID) | INTRAVENOUS | Status: DC
  Administered 2020-03-13 – 2020-03-14 (×4): 500 mg via INTRAVENOUS
  Filled 2020-03-13 (×6): qty 100

## 2020-03-13 MED ORDER — MIDAZOLAM HCL 2 MG/2ML IJ SOLN
INTRAMUSCULAR | Status: DC | PRN
  Administered 2020-03-13: 2 mg via INTRAVENOUS

## 2020-03-13 MED ORDER — LACTATED RINGERS IV SOLN: INTRAVENOUS | Status: DC | PRN

## 2020-03-13 MED ORDER — FENTANYL CITRATE (PF) 100 MCG/2ML IJ SOLN
INTRAMUSCULAR | Status: DC | PRN
Start: 2020-03-13 — End: 2020-03-13
  Administered 2020-03-13 (×2): 50 ug via INTRAVENOUS

## 2020-03-13 MED ORDER — ONDANSETRON 4 MG PO TBDP: 4.0000 mg | ORAL_TABLET | Freq: Four times a day (QID) | ORAL | Status: DC | PRN

## 2020-03-13 MED ORDER — PROPOFOL 10 MG/ML IV BOLUS
INTRAVENOUS | Status: AC
  Filled 2020-03-13: qty 20

## 2020-03-13 MED ORDER — BUPIVACAINE HCL (PF) 0.5 % IJ SOLN
INTRAMUSCULAR | Status: AC
  Filled 2020-03-13: qty 30

## 2020-03-13 MED ORDER — DEXAMETHASONE SODIUM PHOSPHATE 10 MG/ML IJ SOLN
INTRAMUSCULAR | Status: DC | PRN
  Administered 2020-03-13: 10 mg via INTRAVENOUS

## 2020-03-13 MED ORDER — IOHEXOL 9 MG/ML PO SOLN
1000.0000 mL | ORAL | Status: AC | PRN
  Administered 2020-03-13: 1000 mL via ORAL

## 2020-03-13 MED ORDER — SODIUM CHLORIDE 0.9 % IV SOLN
INTRAVENOUS | Status: DC | PRN
  Administered 2020-03-13: 1000 mL via INTRAVENOUS
  Administered 2020-03-13: 250 mL via INTRAVENOUS

## 2020-03-13 MED ORDER — DEXAMETHASONE SODIUM PHOSPHATE 10 MG/ML IJ SOLN
INTRAMUSCULAR | Status: AC
  Filled 2020-03-13: qty 1

## 2020-03-13 MED ORDER — ONDANSETRON HCL 4 MG/2ML IJ SOLN
INTRAMUSCULAR | Status: AC
  Filled 2020-03-13: qty 2

## 2020-03-13 MED ORDER — ACETAMINOPHEN 650 MG RE SUPP: 650.0000 mg | Freq: Four times a day (QID) | RECTAL | Status: DC | PRN

## 2020-03-13 MED ORDER — ROCURONIUM BROMIDE 100 MG/10ML IV SOLN
INTRAVENOUS | Status: DC | PRN
  Administered 2020-03-13: 50 mg via INTRAVENOUS

## 2020-03-13 MED ORDER — KETOROLAC TROMETHAMINE 30 MG/ML IJ SOLN
INTRAMUSCULAR | Status: DC | PRN
  Administered 2020-03-13: 30 mg via INTRAVENOUS

## 2020-03-13 MED ORDER — LIDOCAINE-EPINEPHRINE (PF) 1 %-1:200000 IJ SOLN
INTRAMUSCULAR | Status: DC | PRN
  Administered 2020-03-13: 20 mL

## 2020-03-13 MED ORDER — HEMOSTATIC AGENTS (NO CHARGE) OPTIME
TOPICAL | Status: DC | PRN
  Administered 2020-03-13: 1 via TOPICAL

## 2020-03-13 MED ORDER — ACETAMINOPHEN 10 MG/ML IV SOLN
INTRAVENOUS | Status: AC
  Filled 2020-03-13: qty 100

## 2020-03-13 MED ORDER — ENOXAPARIN SODIUM 40 MG/0.4ML ~~LOC~~ SOLN
40.0000 mg | SUBCUTANEOUS | Status: DC
  Administered 2020-03-13: 40 mg via SUBCUTANEOUS
  Filled 2020-03-13 (×2): qty 0.4

## 2020-03-13 MED ORDER — ACETAMINOPHEN 325 MG PO TABS: 650.0000 mg | ORAL_TABLET | Freq: Four times a day (QID) | ORAL | Status: DC | PRN

## 2020-03-13 MED ORDER — HYDROCODONE-ACETAMINOPHEN 5-325 MG PO TABS
1.0000 | ORAL_TABLET | ORAL | Status: DC | PRN
  Administered 2020-03-13 – 2020-03-14 (×3): 2 via ORAL
  Filled 2020-03-13 (×3): qty 2

## 2020-03-13 MED ORDER — ONDANSETRON HCL 4 MG/2ML IJ SOLN
4.0000 mg | Freq: Four times a day (QID) | INTRAMUSCULAR | Status: DC | PRN
  Administered 2020-03-13: 4 mg via INTRAVENOUS

## 2020-03-13 MED ORDER — LIDOCAINE-EPINEPHRINE (PF) 1 %-1:200000 IJ SOLN
INTRAMUSCULAR | Status: AC
  Filled 2020-03-13: qty 30

## 2020-03-13 MED ORDER — CIPROFLOXACIN IN D5W 400 MG/200ML IV SOLN
400.0000 mg | Freq: Two times a day (BID) | INTRAVENOUS | Status: DC
  Administered 2020-03-13 – 2020-03-14 (×3): 400 mg via INTRAVENOUS
  Filled 2020-03-13 (×4): qty 200

## 2020-03-13 MED ORDER — MIDAZOLAM HCL 2 MG/2ML IJ SOLN
INTRAMUSCULAR | Status: AC
  Filled 2020-03-13: qty 2

## 2020-03-13 MED ORDER — LIDOCAINE HCL (CARDIAC) PF 100 MG/5ML IV SOSY
PREFILLED_SYRINGE | INTRAVENOUS | Status: DC | PRN
  Administered 2020-03-13: 100 mg via INTRAVENOUS

## 2020-03-13 MED ORDER — "VISTASEAL 4 ML SINGLE DOSE KIT "
PACK | CUTANEOUS | Status: DC | PRN
  Administered 2020-03-13: 4 mL via TOPICAL

## 2020-03-13 MED ORDER — ROCURONIUM BROMIDE 10 MG/ML (PF) SYRINGE
PREFILLED_SYRINGE | INTRAVENOUS | Status: AC
  Filled 2020-03-13: qty 10

## 2020-03-13 MED ORDER — KETOROLAC TROMETHAMINE 30 MG/ML IJ SOLN
INTRAMUSCULAR | Status: AC
  Filled 2020-03-13: qty 1

## 2020-03-13 MED ORDER — ACETAMINOPHEN 10 MG/ML IV SOLN
INTRAVENOUS | Status: DC | PRN
  Administered 2020-03-13: 1000 mg via INTRAVENOUS

## 2020-03-13 MED ORDER — MORPHINE SULFATE (PF) 4 MG/ML IV SOLN: 4.0000 mg | INTRAVENOUS | Status: DC | PRN

## 2020-03-13 MED ORDER — PROPOFOL 10 MG/ML IV BOLUS
INTRAVENOUS | Status: DC | PRN
  Administered 2020-03-13: 200 mg via INTRAVENOUS

## 2020-03-13 SURGICAL SUPPLY — 63 items
ANCHOR TIS RET SYS 235ML (MISCELLANEOUS) ×2 IMPLANT
APPLICATOR VISTASEAL 35 (MISCELLANEOUS) ×2 IMPLANT
BAG INFUSER PRESSURE 100CC (MISCELLANEOUS) IMPLANT
BLADE SURG SZ11 CARB STEEL (BLADE) ×2 IMPLANT
CANISTER SUCT 1200ML W/VALVE (MISCELLANEOUS) IMPLANT
CANNULA REDUC XI 12-8 STAPL (CANNULA) ×1
CANNULA REDUCER 12-8 DVNC XI (CANNULA) ×1 IMPLANT
CHLORAPREP W/TINT 26 (MISCELLANEOUS) ×2 IMPLANT
COVER TIP SHEARS 8 DVNC (MISCELLANEOUS) ×1 IMPLANT
COVER TIP SHEARS 8MM DA VINCI (MISCELLANEOUS) ×1
COVER WAND RF STERILE (DRAPES) IMPLANT
DEFOGGER SCOPE WARMER CLEARIFY (MISCELLANEOUS) ×2 IMPLANT
DERMABOND ADVANCED (GAUZE/BANDAGES/DRESSINGS) ×1
DERMABOND ADVANCED .7 DNX12 (GAUZE/BANDAGES/DRESSINGS) ×1 IMPLANT
DRAPE ARM DVNC X/XI (DISPOSABLE) ×4 IMPLANT
DRAPE COLUMN DVNC XI (DISPOSABLE) ×1 IMPLANT
DRAPE DA VINCI XI ARM (DISPOSABLE) ×4
DRAPE DA VINCI XI COLUMN (DISPOSABLE) ×1
ELECT CAUTERY BLADE 6.4 (BLADE) IMPLANT
ELECT REM PT RETURN 9FT ADLT (ELECTROSURGICAL) ×2
ELECTRODE REM PT RTRN 9FT ADLT (ELECTROSURGICAL) ×1 IMPLANT
GLOVE BIOGEL PI IND STRL 7.0 (GLOVE) ×3 IMPLANT
GLOVE BIOGEL PI INDICATOR 7.0 (GLOVE) ×3
GLOVE SURG SYN 6.5 ES PF (GLOVE) ×6 IMPLANT
GOWN STRL REUS W/ TWL LRG LVL3 (GOWN DISPOSABLE) ×3 IMPLANT
GOWN STRL REUS W/TWL LRG LVL3 (GOWN DISPOSABLE) ×3
GRASPER SUT TROCAR 14GX15 (MISCELLANEOUS) IMPLANT
IRRIGATOR SUCT 8 DISP DVNC XI (IRRIGATION / IRRIGATOR) IMPLANT
IRRIGATOR SUCTION 8MM XI DISP (IRRIGATION / IRRIGATOR)
IV NS 1000ML (IV SOLUTION)
IV NS 1000ML BAXH (IV SOLUTION) IMPLANT
KIT PINK PAD W/HEAD ARE REST (MISCELLANEOUS) ×2
KIT PINK PAD W/HEAD ARM REST (MISCELLANEOUS) ×1 IMPLANT
LABEL OR SOLS (LABEL) IMPLANT
NEEDLE HYPO 22GX1.5 SAFETY (NEEDLE) ×2 IMPLANT
NEEDLE INSUFFLATION 14GA 120MM (NEEDLE) ×2 IMPLANT
OBTURATOR OPTICAL STANDARD 8MM (TROCAR) ×1
OBTURATOR OPTICAL STND 8 DVNC (TROCAR) ×1
OBTURATOR OPTICALSTD 8 DVNC (TROCAR) ×1 IMPLANT
PACK LAP CHOLECYSTECTOMY (MISCELLANEOUS) ×2 IMPLANT
PENCIL ELECTRO HAND CTR (MISCELLANEOUS) IMPLANT
POUCH SPECIMEN RETRIEVAL 10MM (ENDOMECHANICALS) ×2 IMPLANT
RELOAD STAPLER 2.5X45 WHT DVNC (STAPLE) ×1 IMPLANT
RELOAD STAPLER 3.5X45 BLU DVNC (STAPLE) ×1 IMPLANT
SEAL CANN UNIV 5-8 DVNC XI (MISCELLANEOUS) ×3 IMPLANT
SEAL XI 5MM-8MM UNIVERSAL (MISCELLANEOUS) ×3
SET TUBE SMOKE EVAC HIGH FLOW (TUBING) ×2 IMPLANT
SOLUTION ELECTROLUBE (MISCELLANEOUS) ×2 IMPLANT
STAPLER 45 DA VINCI SURE FORM (STAPLE) ×1
STAPLER 45 SUREFORM DVNC (STAPLE) ×1 IMPLANT
STAPLER CANNULA SEAL DVNC XI (STAPLE) ×1 IMPLANT
STAPLER CANNULA SEAL XI (STAPLE) ×1
STAPLER RELOAD 2.5X45 WHITE (STAPLE) ×1
STAPLER RELOAD 2.5X45 WHT DVNC (STAPLE) ×1
STAPLER RELOAD 3.5X45 BLU DVNC (STAPLE) ×1
STAPLER RELOAD 3.5X45 BLUE (STAPLE) ×1
SUT MNCRL AB 4-0 PS2 18 (SUTURE) ×2 IMPLANT
SUT VIC AB 3-0 SH 27 (SUTURE) ×1
SUT VIC AB 3-0 SH 27X BRD (SUTURE) ×1 IMPLANT
SUT VICRYL 0 AB UR-6 (SUTURE) ×2 IMPLANT
SYR 30ML LL (SYRINGE) ×2 IMPLANT
SYSTEM WECK SHIELD CLOSURE (TROCAR) IMPLANT
TRAY FOLEY MTR SLVR 16FR STAT (SET/KITS/TRAYS/PACK) IMPLANT

## 2020-03-13 NOTE — ED Notes (Signed)
MD at bedside at this time.

## 2020-03-13 NOTE — ED Notes (Signed)
This RN to bedside at this time. Introduced self to Pt. Pt complains of 7/10 pain in right abdomen. Pt states no further needs at this time.

## 2020-03-13 NOTE — H&P (Signed)
SURGICAL CONSULTATION NOTE   HISTORY OF PRESENT ILLNESS (HPI):  39 y.o. male presented to [facility] ED for evaluation of abdominal pain since 3 AM. Patient reports started with periumbilical pain at 3 AM.  At 6 in the morning he started vomiting and then the pain radiated and localized to the right lower quadrant.  There is no alleviating or aggravating factor identified.  Due to multiple episode of vomiting and persistent pain he decided to come to the ED.  At the ED he had labs that shows mild leukocytosis.  CT scan of the abdominal pelvis shows confirmation of inflammation of the appendix.  I personally evaluated the images.  There is no sign of perforation or abscess at this moment.  Surgery is consulted by Dr. Cherylann Banas in this context for evaluation and management of acute appendicitis.  PAST MEDICAL HISTORY ([facility]):  History reviewed. No pertinent past medical history.   PAST SURGICAL HISTORY ([place]):  History reviewed. No pertinent surgical history.   MEDICATIONS:  Prior to Admission medications   Medication Sig Start Date End Date Taking? Authorizing Provider  Omega-3 Fatty Acids (FISH OIL) 1000 MG CAPS Take 1 capsule by mouth.    [provider]     ALLERGIES:  Allergies  Allergen Reactions  . Penicillins      SOCIAL HISTORY:  Social History   Socioeconomic History  . Marital status: Married    Spouse name: Not on file  . Number of children: 2  . Years of education: Not on file  . Highest education level: Not on file  Occupational History  . Occupation: Technical brewer in [employer]  Tobacco Use  . Smoking status: Former Research scientist (life sciences)  . Smokeless tobacco: Never Used  Substance and Sexual Activity  . Alcohol use: Yes    Alcohol/week: 0.0 standard drinks  . Drug use: No  . Sexual activity: Not on file  Other Topics Concern  . Not on file  Social History Narrative   Has 2 children from prior relationship. Shared custody   Social Determinants of Health   Financial  Resource Strain:   . Difficulty of Paying Living Expenses: Not on file  Food Insecurity:   . Worried About Charity fundraiser in the Last Year: Not on file  . Ran Out of Food in the Last Year: Not on file  Transportation Needs:   . Lack of Transportation (Medical): Not on file  . Lack of Transportation (Non-Medical): Not on file  Physical Activity:   . Days of Exercise per Week: Not on file  . Minutes of Exercise per Session: Not on file  Stress:   . Feeling of Stress : Not on file  Social Connections:   . Frequency of Communication with Friends and Family: Not on file  . Frequency of Social Gatherings with Friends and Family: Not on file  . Attends Religious Services: Not on file  . Active Member of Clubs or Organizations: Not on file  . Attends Archivist Meetings: Not on file  . Marital Status: Not on file  Intimate Partner Violence:   . Fear of Current or Ex-Partner: Not on file  . Emotionally Abused: Not on file  . Physically Abused: Not on file  . Sexually Abused: Not on file      FAMILY HISTORY:  Family History  Problem Relation Age of Onset  . CAD Neg Hx   . Diabetes Neg Hx   . Hypertension Neg Hx   . Cancer Neg Hx  no colon or protate cancer     REVIEW OF SYSTEMS:  Constitutional: denies weight loss, fever, chills, or sweats  Eyes: denies any other vision changes, history of eye injury  ENT: denies sore throat, hearing problems  Respiratory: denies shortness of breath, wheezing  Cardiovascular: denies chest pain, palpitations  Gastrointestinal: positive abdominal pain, nausea and vomiting Genitourinary: denies burning with urination or urinary frequency Musculoskeletal: denies any other joint pains or cramps  Skin: denies any other rashes or skin discolorations  Neurological: denies any other headache, dizziness, weakness  Psychiatric: denies any other depression, anxiety   All other review of systems were negative   VITAL SIGNS:  Temp:   [98.5 F (36.9 C)] 98.5 F (36.9 C) (09/06 0659) Pulse Rate:  [74-89] 89 (09/06 0900) Resp:  [16] 16 (09/06 0900) BP: (106-115)/(55-68) 115/68 (09/06 0900) SpO2:  [99 %] 99 % (09/06 0900) Weight:  [100.2 kg] 100.2 kg (09/06 0701)     Height: 6\' 3"  (190.5 cm) Weight: 100.2 kg BMI (Calculated): 27.61   INTAKE/OUTPUT:  This shift: No intake/output data recorded.  Last 2 shifts: @IOLAST2SHIFTS @   PHYSICAL EXAM:  Constitutional:  -- Normal body habitus  -- Awake, alert, and oriented x3  Eyes:  -- Pupils equally round and reactive to light  -- No scleral icterus  Ear, nose, and throat:  -- No jugular venous distension  Pulmonary:  -- No crackles  -- Equal breath sounds bilaterally -- Breathing non-labored at rest Cardiovascular:  -- S1, S2 present  -- No pericardial rubs Gastrointestinal:  -- Abdomen soft, tender to palpation in the right lower quadrant, non-distended, no guarding or rebound tenderness -- No abdominal masses appreciated, pulsatile or otherwise  Musculoskeletal and Integumentary:  -- Wounds or skin discoloration: None appreciated -- Extremities: B/L UE and LE FROM, hands and feet warm, no edema  Neurologic:  -- Motor function: intact and symmetric -- Sensation: intact and symmetric   Labs:  CBC Latest Ref Rng & Units 03/13/2020  WBC 4.0 - 10.5 K/uL 11.3(H)  Hemoglobin 13.0 - 17.0 g/dL 15.0  Hematocrit 39 - 52 % 42.7  Platelets 150 - 400 K/uL 166   CMP Latest Ref Rng & Units 03/13/2020  Glucose 70 - 99 mg/dL 131(H)  BUN 6 - 20 mg/dL 11  Creatinine 0.61 - 1.24 mg/dL 1.00  Sodium 135 - 145 mmol/L 138  Potassium 3.5 - 5.1 mmol/L 3.9  Chloride 98 - 111 mmol/L 102  CO2 22 - 32 mmol/L 26  Calcium 8.9 - 10.3 mg/dL 9.1  Total Protein 6.5 - 8.1 g/dL 7.3  Total Bilirubin 0.3 - 1.2 mg/dL 1.0  Alkaline Phos 38 - 126 U/L 63  AST 15 - 41 U/L 21  ALT 0 - 44 U/L 19    Imaging studies:   EXAM: CT ABDOMEN AND PELVIS WITH CONTRAST  TECHNIQUE: Multidetector CT  imaging of the abdomen and pelvis was performed using the standard protocol following bolus administration of intravenous contrast.  CONTRAST:  15mL OMNIPAQUE IOHEXOL 300 MG/ML  SOLN  COMPARISON:  None.  FINDINGS: Lower chest: No acute abnormality.  Hepatobiliary: No focal liver abnormality is seen. No gallstones, gallbladder wall thickening, or biliary dilatation.  Pancreas: Unremarkable. No pancreatic ductal dilatation or surrounding inflammatory changes.  Spleen: Normal in size without focal abnormality.  Adrenals/Urinary Tract: Adrenal glands are unremarkable. Kidneys are normal, without renal calculi, focal lesion, or hydronephrosis. Bladder is unremarkable.  Stomach/Bowel: The stomach appears normal. There is no evidence of bowel obstruction. The appendix  is enlarged with surrounding inflammation consistent with acute appendicitis.  Appendix: Location: Right lower quadrant.  Diameter: 13 mm.  Appendicolith: No.  Mucosal hyper-enhancement: Yes.  Extraluminal gas: No.  Periappendiceal collection: No.  Vascular/Lymphatic: No significant vascular findings are present. No enlarged abdominal or pelvic lymph nodes.  Reproductive: Prostate is unremarkable.  Other: No abdominal wall hernia or abnormality. No abdominopelvic ascites.  Musculoskeletal: No acute or significant osseous findings.  IMPRESSION: Findings consistent with acute appendicitis. No abscess is noted.   Electronically Signed   By: Marijo Conception M.D.   On: 03/13/2020 09:46  Assessment/Plan:  39 y.o. male with acute appendicitis.  Patient with history, physical exam and images consistent with acute appendicitis. Patient oriented about diagnosis and surgical management as treatment. Patient oriented about goals of surgery and its risk including: bowel injury, infection, abscess, bleeding, leak from cecum, intestinal adhesions, bowel obstruction, fistula, injury to the  ureter among others.  Patient understood and agreed to proceed with surgery. Will admit patient, already started on antibiotic therapy, will give IV hydration since patient is NPO and schedule to OR.   Arnold Long, MD

## 2020-03-13 NOTE — ED Provider Notes (Signed)
[facility] Emergency Department Provider Note ____________________________________________   First MD Initiated Contact with Patient 03/13/20 0901     (approximate)  I have reviewed the triage vital signs and the nursing notes.   HISTORY  Chief Complaint No chief complaint on file.    HPI Greg Wong is a 39 y.o. male with no active medical problems who presents with abdominal pain, acute onset early this morning and initially epigastric.  He reports several episodes of vomiting.  Since then the pain has gotten less intense, but has migrated to the right mid abdomen.  He denies any prior history of this pain.  The patient denies any diarrhea, but states that he did have a bowel movement a few hours ago which had a small amount of blood in it.  History reviewed. No pertinent past medical history.  Patient Active Problem List   Diagnosis Date Noted  . Acute appendicitis 03/13/2020  . Cerumen impaction 10/31/2015  . 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 No pertinent surgical history.  Prior to Admission medications   Not on File    Allergies Penicillins  Family History  Problem Relation Age of Onset  . CAD Neg Hx   . Diabetes Neg Hx   . Hypertension Neg Hx   . Cancer Neg Hx        no colon or protate cancer    Social History Social History   Tobacco Use  . Smoking status: Former Research scientist (life sciences)  . Smokeless tobacco: Never Used  Substance Use Topics  . Alcohol use: Yes    Alcohol/week: 0.0 standard drinks  . Drug use: No    Review of Systems  Constitutional: No fever/chills. Eyes: No visual changes. ENT: No sore throat. Cardiovascular: Denies chest pain. Respiratory: Denies shortness of breath. Gastrointestinal: Positive for resolved vomiting. Genitourinary: Negative for dysuria.  Musculoskeletal: Negative for back pain. Skin: Negative for rash. Neurological: Negative for  headache.   ____________________________________________   PHYSICAL EXAM:  VITAL SIGNS: ED Triage Vitals  Enc Vitals Group     BP 03/13/20 0659 (!) 106/55     Pulse Rate 03/13/20 0659 74     Resp 03/13/20 0659 16     Temp 03/13/20 0659 98.5 F (36.9 C)     Temp Source 03/13/20 0659 Oral     SpO2 03/13/20 0659 99 %     Weight 03/13/20 0701 220 lb 14.4 oz (100.2 kg)     Height 03/13/20 0701 6\' 3"  (1.905 m)     Head Circumference --      Peak Flow --      Pain Score 03/13/20 0701 8     Pain Loc --      Pain Edu? --      Excl. in [place]? --     Constitutional: Alert and oriented. Well appearing and in no acute distress. Eyes: Conjunctivae are normal.  No scleral icterus. Head: Atraumatic. Nose: No congestion/rhinnorhea. Mouth/Throat: Mucous membranes are moist.   Neck: Normal range of motion.  Cardiovascular: Normal rate, regular rhythm. Good peripheral circulation. Respiratory: Normal respiratory effort.  No retractions.  Gastrointestinal: Soft with mild right mid abdominal tenderness.  No distention.  Genitourinary: No flank tenderness. Musculoskeletal:  Extremities warm and well perfused.  Neurologic:  Normal speech and language. No gross focal neurologic deficits are appreciated.  Skin:  Skin is warm and dry. No rash noted. Psychiatric: Mood and affect are normal. Speech and behavior are normal.  ____________________________________________  LABS (all labs ordered are listed, but only abnormal results are displayed)  Labs Reviewed  COMPREHENSIVE METABOLIC PANEL - Abnormal; Notable for the following components:      Result Value   Glucose, Bld 131 (*)    All other components within normal limits  CBC - Abnormal; Notable for the following components:   WBC 11.3 (*)    All other components within normal limits  URINALYSIS, COMPLETE (UACMP) WITH MICROSCOPIC - Abnormal; Notable for the following components:   Color, Urine YELLOW (*)    APPearance CLOUDY (*)    pH  9.0 (*)    Ketones, ur 5 (*)    Protein, ur 30 (*)    All other components within normal limits  SARS CORONAVIRUS 2 BY RT PCR (HOSPITAL ORDER, [place] [facility])  LIPASE, BLOOD  HIV ANTIBODY (ROUTINE TESTING W REFLEX)   ____________________________________________  EKG   ____________________________________________  RADIOLOGY  CT abdomen/pelvis: Acute appendicitis  ____________________________________________   PROCEDURES  Procedure(s) performed: No  Procedures  Critical Care performed: No ____________________________________________   INITIAL IMPRESSION / ASSESSMENT AND PLAN / ED COURSE  Pertinent labs & imaging results that were available during my care of the patient were reviewed by me and considered in my medical decision making (see chart for details).  39 year old male presents with abdominal pain since early this morning, initially epigastric and now more in the right mid to lower abdomen.  He initially had vomiting but this has subsided.  He had 1 bowel movement with a small amount of blood.  On exam, the patient is well-appearing.  His vital signs are normal.  He does have tenderness in the right mid abdomen.  The physical exam is otherwise unremarkable.  Initial lab work-up obtained from triage is significant only for mild leukocytosis.  Differential includes foodborne illness, gastroenteritis (including possible bacterial etiology given the blood in the stool) or less likely appendicitis, biliary colic, or cholecystitis.  Based on shared decision making with the patient, he is strongly concerned for appendicitis so we will obtain a CT scan to further evaluate.  ----------------------------------------- 10:06 AM on 03/13/2020 -----------------------------------------  CT findings consistent with acute appendicitis.  I have consulted Dr. Peyton Najjar from general surgery.  ____________________________________________   FINAL CLINICAL  IMPRESSION(S) / ED DIAGNOSES  Final diagnoses:  Acute appendicitis, unspecified acute appendicitis type      NEW MEDICATIONS STARTED DURING THIS VISIT:  New Prescriptions   No medications on file     Note:  This document was prepared using Dragon voice recognition software and may include unintentional dictation errors.    Arta Silence, MD 03/13/20 1051

## 2020-03-13 NOTE — Op Note (Signed)
Pre-op Diagnosis: Acute appendicitis   Post op Diagnosis: Acute appenditicis  Procedure: Robotic assisted laparoscopic appendectomy.  Anesthesia: GETA  Surgeon: Herbert Pun, MD, FACS  Wound Classification: clean contaminated  Specimen: Appendix  Complications: None  Estimated Blood Loss: 3 mL   Indications: Patient is a 39 y.o. male  presented with above right lower quadrant pain. CT scan shows acute appendicitis.     FIndings: 1.  Irritated appendix mild peri appendix edema.  2. No peri-appendiceal abscess or phlegmon 3. Normal anatomy 4. Adequate hemostasis.   Description of procedure: The patient was placed on the operating table in the supine position. General anesthesia was induced. A time-out was completed verifying correct patient, procedure, site, positioning, and implant(s) and/or special equipment prior to beginning this procedure. The abdomen was prepped and draped in the usual sterile fashion.   Palmer's point located and Veress needle was inserted.  After confirming 2 clicks and a positive saline drop test, gas insufflation was initiated until the abdominal pressure was measured at 15 mmHg.  Afterwards, the Veress needle was removed and a 8 mm port was placed in Left upper quadrant area using Optiview technique.  After local was infused, 2 additional incision was made 8 cm apart along the left side of the abdominal wall from the initial incision.  An 12 mm port was placed at the left lower quadrant port under direct visualization.  No injuries from trocar placements were noted. The table was placed in the Trendelenburg position with the right side elevated.  With the use of Tip up grasper, Force Bipolar and Vessel sealer, an inflamed appendix was identified and elevated.  Window created at base of appendix in the mesentery.   An  blue load linear cutting stapler was then used to divide and staple the base of the appendix. Mesoappendix was divided with Vessel  sealer. The appendix was placed in an endoscopic retrieval bag and removed.   The appendiceal stump was examined and hemostasis achieved with Vistaseal. No other pathology was identified within pelvis. The 12 mm trocar removed and port site closed with PMI using 0 vicryl under direct vision. Remaining trocars were removed under direct vision. No bleeding was noted.The abdomen was allowed to collapse.  All skin incisions then closed with subcuticular sutures Monocryl 4-0.  Wounds then dressed with dermabond.  The patient tolerated the procedure well, awakened from anesthesia and was taken to the postanesthesia care unit in satisfactory condition.  Sponge count and instrument count correct at the end of the procedure.

## 2020-03-13 NOTE — Transfer of Care (Signed)
Immediate Anesthesia Transfer of Care Note  Patient: Greg Wong  Procedure(s) Performed: XI ROBOTIC LAPAROSCOPIC ASSISTED APPENDECTOMY (N/A Abdomen)  Patient Location: PACU  Anesthesia Type:General  Level of Consciousness: awake, alert  and oriented  Airway & Oxygen Therapy: Patient Spontanous Breathing and Patient connected to face mask oxygen  Post-op Assessment: Report given to RN and Post -op Vital signs reviewed and stable  Post vital signs: Reviewed and stable  Last Vitals:  Vitals Value Taken Time  BP 123/82 03/13/20 2311  Temp 36.3 C 03/13/20 2311  Pulse 75 03/13/20 2313  Resp 16 03/13/20 2313  SpO2 98 % 03/13/20 2313  Vitals shown include unvalidated device data.  Last Pain:  Vitals:   03/13/20 1949  TempSrc:   PainSc: 2       Patients Stated Pain Goal: 2 (90/24/09 7353)  Complications: No complications documented.

## 2020-03-13 NOTE — Anesthesia Preprocedure Evaluation (Signed)
Anesthesia Evaluation  Patient identified by MRN, date of birth, ID band Patient awake    Reviewed: Allergy & Precautions, H&P , NPO status , Patient's Chart, lab work & pertinent test results, reviewed documented beta blocker date and time   History of Anesthesia Complications Negative for: history of anesthetic complications  Airway Mallampati: I  TM Distance: >3 FB Neck ROM: full    Dental  (+) Dental Advidsory Given, Teeth Intact   Pulmonary neg pulmonary ROS, former smoker,    Pulmonary exam normal breath sounds clear to auscultation       Cardiovascular Exercise Tolerance: Good negative cardio ROS Normal cardiovascular exam Rhythm:regular Rate:Normal     Neuro/Psych negative neurological ROS  negative psych ROS   GI/Hepatic negative GI ROS, Neg liver ROS,   Endo/Other  negative endocrine ROS  Renal/GU negative Renal ROS  negative genitourinary   Musculoskeletal   Abdominal   Peds  Hematology negative hematology ROS (+)   Anesthesia Other Findings History reviewed. No pertinent past medical history.   Reproductive/Obstetrics negative OB ROS                             Anesthesia Physical Anesthesia Plan  ASA: I  Anesthesia Plan: General   Post-op Pain Management:    Induction: Intravenous  PONV Risk Score and Plan: 2 and Ondansetron, Dexamethasone, Midazolam, Promethazine and Treatment may vary due to age or medical condition  Airway Management Planned: Oral ETT  Additional Equipment:   Intra-op Plan:   Post-operative Plan: Extubation in OR  Informed Consent: I have reviewed the patients History and Physical, chart, labs and discussed the procedure including the risks, benefits and alternatives for the proposed anesthesia with the patient or authorized representative who has indicated his/her understanding and acceptance.     Dental Advisory Given  Plan Discussed  with: Anesthesiologist, CRNA and Surgeon  Anesthesia Plan Comments:         Anesthesia Quick Evaluation

## 2020-03-13 NOTE — Anesthesia Procedure Notes (Signed)
Procedure Name: Intubation Date/Time: 03/13/2020 9:40 PM Performed by: Allean Found, CRNA Pre-anesthesia Checklist: Patient identified, Patient being monitored, Timeout performed, Emergency Drugs available and Suction available Patient Re-evaluated:Patient Re-evaluated prior to induction Oxygen Delivery Method: Circle system utilized Preoxygenation: Pre-oxygenation with 100% oxygen Induction Type: IV induction Ventilation: Mask ventilation without difficulty Laryngoscope Size: McGraph and 4 Grade View: Grade I Tube type: Oral Tube size: 7.5 mm Number of attempts: 1 Airway Equipment and Method: Stylet Placement Confirmation: positive ETCO2,  breath sounds checked- equal and bilateral and ETT inserted through vocal cords under direct vision Secured at: 25 cm Tube secured with: Tape Dental Injury: Teeth and Oropharynx as per pre-operative assessment

## 2020-03-13 NOTE — ED Triage Notes (Signed)
Woke up this morning with severe stomach cramps.  Reports vomiting 5 times.  C/O mid abdominal pain radiating toward RLQ.  Describes pain as sharp.  AAOx3.  Skin warm and dry. NAD

## 2020-03-14 ENCOUNTER — Encounter: Payer: Self-pay | Admitting: General Surgery

## 2020-03-14 LAB — HIV ANTIBODY (ROUTINE TESTING W REFLEX): HIV Screen 4th Generation wRfx: NONREACTIVE

## 2020-03-14 MED ORDER — HYDROCODONE-ACETAMINOPHEN 5-325 MG PO TABS
1.0000 | ORAL_TABLET | ORAL | 0 refills | Status: AC | PRN
Start: 2020-03-14 — End: 2020-03-17

## 2020-03-14 NOTE — Progress Notes (Signed)
Greg Wong to be D/C'd home per MD order.  Discussed prescriptions and follow up appointments with the patient. Prescriptions given to patient, medication list explained in detail. Pt verbalized understanding.  Allergies as of 03/14/2020       Reactions   Penicillins         Medication List     TAKE these medications    HYDROcodone-acetaminophen 5-325 MG tablet Commonly known as: Norco Take 1 tablet by mouth every 4 (four) hours as needed for up to 3 days for moderate pain.        Vitals:   03/14/20 0801 03/14/20 1127  BP: 109/70 114/66  Pulse: 62 75  Resp: 16 16  Temp: 98.4 F (36.9 C) 98.1 F (36.7 C)  SpO2: 96% 98%    Skin clean, dry and intact without evidence of skin break down, no evidence of skin tears noted. IV catheter discontinued intact. Site without signs and symptoms of complications. Dressing and pressure applied. Pt denies pain at this time. No complaints noted.  An After Visit Summary was printed and given to the patient. Patient escorted via [place], and D/C home via private auto.  Greg Wong

## 2020-03-14 NOTE — Discharge Instructions (Signed)

## 2020-03-14 NOTE — Discharge Summary (Signed)
  Patient ID: Greg Wong MRN: [id number] DOB/AGE: 1980/11/02 39 y.o.  Admit date: 03/13/2020 Discharge date: 03/14/2020   Discharge Diagnoses:  Active Problems:   Acute appendicitis   Procedures: Robotic assisted laparoscopic appendectomy.  Hospital Course: Patient came with acute appendicitis.  He was admitted for surgical management.  He underwent robotic assisted laparoscopic appendectomy.  He tolerated procedure well.  Today without pain.  Wounds are healing well.  Patient tolerating diet.  Patient ambulating.  Physical Exam Constitutional:      Appearance: Normal appearance.  Cardiovascular:     Rate and Rhythm: Normal rate and regular rhythm.  Pulmonary:     Effort: Pulmonary effort is normal.  Abdominal:     General: Abdomen is flat. There is no distension.     Palpations: Abdomen is soft.  Neurological:     Mental Status: He is alert and oriented to person, place, and time.      Consults: Negative   Disposition: Discharge disposition: 01-Home or Self Care       Discharge Instructions    Diet - low sodium heart healthy   Complete by: As directed    Increase activity slowly   Complete by: As directed      Allergies as of 03/14/2020      Reactions   Penicillins       Medication List    TAKE these medications   HYDROcodone-acetaminophen 5-325 MG tablet Commonly known as: Norco Take 1 tablet by mouth every 4 (four) hours as needed for up to 3 days for moderate pain.       Follow-up Information    Herbert Pun, MD Follow up in 2 week(s).   Specialty: General Surgery Contact information: [address] [place] [address] [phone]

## 2020-03-14 NOTE — Anesthesia Postprocedure Evaluation (Signed)
Anesthesia Post Note  Patient: Greg Wong  Procedure(s) Performed: XI ROBOTIC LAPAROSCOPIC ASSISTED APPENDECTOMY (N/A Abdomen)  Patient location during evaluation: PACU Anesthesia Type: General Level of consciousness: awake and alert Pain management: pain level controlled Vital Signs Assessment: post-procedure vital signs reviewed and stable Respiratory status: spontaneous breathing, nonlabored ventilation, respiratory function stable and patient connected to nasal cannula oxygen Cardiovascular status: blood pressure returned to baseline and stable Postop Assessment: no apparent nausea or vomiting Anesthetic complications: no   No complications documented.   Last Vitals:  Vitals:   03/14/20 0506 03/14/20 0801  BP: 106/62 109/70  Pulse: 60 62  Resp: 17 16  Temp: 36.9 C 36.9 C  SpO2: 97% 96%    Last Pain:  Vitals:   03/14/20 0801  TempSrc: Oral  PainSc: 0-No pain                 Martha Clan

## 2020-03-15 LAB — SURGICAL PATHOLOGY

## 2020-03-18 ENCOUNTER — Telehealth: Payer: Self-pay

## 2020-03-18 NOTE — Telephone Encounter (Signed)
Spoke to pt to see how he was doing after recent procedure. He said he has a little tightness but other than that he is doing well.

## 2020-08-08 ENCOUNTER — Encounter: Payer: Self-pay | Admitting: Internal Medicine

## 2020-08-08 ENCOUNTER — Telehealth (INDEPENDENT_AMBULATORY_CARE_PROVIDER_SITE_OTHER): Payer: 59 | Admitting: Internal Medicine

## 2020-08-08 ENCOUNTER — Other Ambulatory Visit: Payer: Self-pay

## 2020-08-08 DIAGNOSIS — R0981 Nasal congestion: Secondary | ICD-10-CM | POA: Diagnosis not present

## 2020-08-08 NOTE — Assessment & Plan Note (Signed)
Going on for a long time He can see sore areas or masses that bleed then crust Heat is heat pump Only used OTC nasal decongestants for 1-2 days Unclear if mass or not  Will set up with ENT Trial fluticasone till he can be seen

## 2020-08-08 NOTE — Progress Notes (Signed)
   Subjective:    Patient ID: Greg Wong, male    DOB: 16-Mar-1981, 40 y.o.   MRN: [id number]  HPI Video virtual visit due to a sore on his nose Identification done Reviewed limitations and billing and Greg Wong gave consent Participants---patient in his home and I am in my office  Started with sore in right side --getting bigger Now on the left side also Totally congested in the morning Greg Wong can feel and see something Keeps a scab inside  Started last summer and getting worse Tried nasal rinse---Neti pot with salt water Did use OTC nasal spray--very limited  No current outpatient medications on file prior to visit.   No current facility-administered medications on file prior to visit.    Allergies  Allergen Reactions  . Penicillins     History reviewed. No pertinent past medical history.  Past Surgical History:  Procedure Laterality Date  . XI ROBOTIC LAPAROSCOPIC ASSISTED APPENDECTOMY N/A 03/13/2020   Procedure: XI ROBOTIC LAPAROSCOPIC ASSISTED APPENDECTOMY;  Surgeon: Herbert Pun, MD;  Location: [facility] ORS;  Service: General;  Laterality: N/A;    Family History  Problem Relation Age of Onset  . CAD Neg Hx   . Diabetes Neg Hx   . Hypertension Neg Hx   . Cancer Neg Hx        no colon or protate cancer    Social History   Socioeconomic History  . Marital status: Married    Spouse name: Not on file  . Number of children: 2  . Years of education: Not on file  . Highest education level: Not on file  Occupational History  . Occupation: Technical brewer in [employer]  Tobacco Use  . Smoking status: Former Research scientist (life sciences)  . Smokeless tobacco: Never Used  Substance and Sexual Activity  . Alcohol use: Yes    Alcohol/week: 0.0 standard drinks  . Drug use: No  . Sexual activity: Yes  Other Topics Concern  . Not on file  Social History Narrative   Has 2 children from prior relationship. Shared custody   Social Determinants of Health   Financial Resource Strain: Not on  file  Food Insecurity: Not on file  Transportation Needs: Not on file  Physical Activity: Not on file  Stress: Not on file  Social Connections: Not on file  Intimate Partner Violence: Not on file   Review of Systems  No allergy problems Heat pump     Objective:   Physical Exam Constitutional:      Appearance: Normal appearance.  HENT:     Nose:     Comments: Greg Wong tried flashlight but unable to really see inside nose Slightly tender when Greg Wong palpated from outside but Greg Wong could not feel any mass Neurological:     Mental Status: Greg Wong is alert.            Assessment & Plan:

## 2020-08-25 ENCOUNTER — Encounter (INDEPENDENT_AMBULATORY_CARE_PROVIDER_SITE_OTHER): Payer: Self-pay | Admitting: Otolaryngology

## 2020-08-25 ENCOUNTER — Ambulatory Visit (INDEPENDENT_AMBULATORY_CARE_PROVIDER_SITE_OTHER): Payer: 59 | Admitting: Otolaryngology

## 2020-08-25 ENCOUNTER — Other Ambulatory Visit: Payer: Self-pay

## 2020-08-25 VITALS — Temp 97.5°F

## 2020-08-25 DIAGNOSIS — J3489 Other specified disorders of nose and nasal sinuses: Secondary | ICD-10-CM

## 2020-08-25 DIAGNOSIS — J310 Chronic rhinitis: Secondary | ICD-10-CM

## 2020-08-25 NOTE — Progress Notes (Signed)
HPI: Greg Wong is a 40 y.o. male who presents is referred by his PCP for evaluation of nasal congestion and sores that have occurred around the nostril for the past year.  He has had sores on both sides of the nostril but the right side has been worse recently.  He has tried saline rinses and was recently prescribed Flonase.Marland Kitchen  No past medical history on file. Past Surgical History:  Procedure Laterality Date  . XI ROBOTIC LAPAROSCOPIC ASSISTED APPENDECTOMY N/A 03/13/2020   Procedure: XI ROBOTIC LAPAROSCOPIC ASSISTED APPENDECTOMY;  Surgeon: Herbert Pun, MD;  Location: [facility] ORS;  Service: General;  Laterality: N/A;   Social History   Socioeconomic History  . Marital status: Married    Spouse name: Not on file  . Number of children: 2  . Years of education: Not on file  . Highest education level: Not on file  Occupational History  . 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 Smoking status: Former Smoker    Packs/day: 0.25    Years: 5.00    Pack years: 1.25  . Smokeless tobacco: Never Used  Substance and Sexual Activity  . Alcohol use: Yes    Alcohol/week: 0.0 standard drinks  . Drug use: No  . Sexual activity: Yes  Other Topics Concern  . Not on file  Social History Narrative   Has 2 children from prior relationship. Shared custody   Social Determinants of Health   Financial Resource Strain: Not on file  Food Insecurity: Not on file  Transportation Needs: Not on file  Physical Activity: Not on file  Stress: Not on file  Social Connections: Not on file   Family History  Problem Relation Age of Onset  . CAD Neg Hx   . Diabetes Neg Hx   . Hypertension Neg Hx   . Cancer Neg Hx        no colon or protate cancer   Allergies  Allergen Reactions  . Penicillins    Prior to Admission medications   Not on File     Positive ROS: Otherwise negative  All other systems have been reviewed and were otherwise negative with the exception of those  mentioned in the HPI and as above.  Physical Exam: Constitutional: Alert, well-appearing, no acute distress Ears: External ears without lesions or tenderness. Ear canals are clear bilaterally with intact, clear TMs.  Nasal: External nose without lesions. Septum with slight deviation to the left.  Mild rhinitis on both sides.  Both middle meatus regions are clear bilaterally.  No polyps noted.  After decongesting the nose both nasal cavities were clear..  Of note he has some scabbing and crusting along the inferior aspect of the nasal vestibules at the border of the mucous skin membrane inferiorly.  This is consistent with mild vestibulitis. Oral: Lips and gums without lesions. Tongue and palate mucosa without lesions. Posterior oropharynx clear. Neck: No palpable adenopathy or masses Respiratory: Breathing comfortably  Skin: No facial/neck lesions or rash noted.  Procedures  Assessment: Mild nasal vestibulitis. Chronic rhinitis.  Plan: For the small scabbing and sores in the nose recommended use of mupirocin 2% ointment application twice daily for a week.  Recommended not picking the scabs when they develop. For the nasal congestion recommended regular use of either Nasacort or Flonase 2 sprays each nostril at night and discussed with him that this will take 5 to 7 days for it to reach maximum decongestion of the nasal cavities.  Reassured him of  no structural abnormalities of any significance.  However if down the road he continues to have trouble breathing through his nose there are surgical options that could improve this.   Radene Journey, MD   CC:

## 2020-10-06 ENCOUNTER — Encounter: Payer: Self-pay | Admitting: Internal Medicine

## 2020-10-06 ENCOUNTER — Ambulatory Visit (INDEPENDENT_AMBULATORY_CARE_PROVIDER_SITE_OTHER): Payer: 59 | Admitting: Internal Medicine

## 2020-10-06 ENCOUNTER — Other Ambulatory Visit: Payer: Self-pay

## 2020-10-06 DIAGNOSIS — D229 Melanocytic nevi, unspecified: Secondary | ICD-10-CM | POA: Diagnosis not present

## 2020-10-06 DIAGNOSIS — Z0001 Encounter for general adult medical examination with abnormal findings: Secondary | ICD-10-CM

## 2020-10-06 DIAGNOSIS — Z0000 Encounter for general adult medical examination without abnormal findings: Secondary | ICD-10-CM | POA: Insufficient documentation

## 2020-10-06 MED ORDER — VALACYCLOVIR HCL 1 G PO TABS: 2000.0000 mg | ORAL_TABLET | Freq: Once | ORAL | 2 refills | Status: AC

## 2020-10-06 NOTE — Assessment & Plan Note (Signed)
Healthy Stays fit Recommended COVID vaccine--he is not excited about this (he did have it) Flu vaccine in the fall---he will consider Too young for cancer screening

## 2020-10-06 NOTE — Assessment & Plan Note (Addendum)
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

## 2020-10-06 NOTE — Progress Notes (Signed)
Subjective:    Patient ID: Greg Wong, male    DOB: 04/08/1981, 40 y.o.   MRN: [id number]  HPI Here for physical This visit occurred during the SARS-CoV-2 public health emergency.  Safety protocols were in place, including screening questions prior to the visit, additional usage of staff PPE, and extensive cleaning of exam room while observing appropriate contact time as indicated for disinfecting solutions.   Had appendectomy back in the fall Recovered quickly  Has new body armor Now moles on back get irritated  Current Outpatient Medications on File Prior to Visit  Medication Sig Dispense Refill  . valACYclovir (VALTREX) 1000 MG tablet Take 2,000 mg by mouth once.     No current facility-administered medications on file prior to visit.    Allergies  Allergen Reactions  . Penicillins     History reviewed. No pertinent past medical history.  Past Surgical History:  Procedure Laterality Date  . XI ROBOTIC LAPAROSCOPIC ASSISTED APPENDECTOMY N/A 03/13/2020   Procedure: XI ROBOTIC LAPAROSCOPIC ASSISTED APPENDECTOMY;  Surgeon: Herbert Pun, MD;  Location: [facility] ORS;  Service: General;  Laterality: N/A;    Family History  Problem Relation Age of Onset  . CAD Neg Hx   . Diabetes Neg Hx   . Hypertension Neg Hx   . Cancer Neg Hx        no colon or protate cancer    Social History   Socioeconomic History  . Marital status: Married    Spouse name: Not on file  . Number of children: 2  . Years of education: Not on file  . Highest education level: Not on file  Occupational History  . 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 Smoking status: Former Smoker    Packs/day: 0.25    Years: 5.00    Pack years: 1.25  . Smokeless tobacco: Never Used  Substance and Sexual Activity  . Alcohol use: Yes    Alcohol/week: 0.0 standard drinks  . Drug use: No  . Sexual activity: Yes  Other Topics Concern  . Not on file  Social History Narrative   Has 2  children from prior relationship. Shared custody   Social Determinants of Health   Financial Resource Strain: Not on file  Food Insecurity: Not on file  Transportation Needs: Not on file  Physical Activity: Not on file  Stress: Not on file  Social Connections: Not on file  Intimate Partner Violence: Not on file   Review of Systems  Constitutional: Negative for fatigue.       Working out [employer] up some (may be muscle)  HENT: Negative for dental problem, hearing loss and trouble swallowing.        Tinnitus still in right ear Keeps up with dentist  Eyes: Negative for visual disturbance.       No diplopia or unilateral vision loss  Respiratory: Negative for cough, chest tightness and shortness of breath.   Cardiovascular: Negative for chest pain, palpitations and leg swelling.  Gastrointestinal: Negative for blood in stool and constipation.       Occ heartburn--uses tums with success  Endocrine: Negative for polydipsia and polyuria.  Genitourinary: Negative for difficulty urinating and urgency.       No sexual problems  Musculoskeletal: Negative for arthralgias, back pain and joint swelling.  Skin: Negative for rash.  Neurological: Negative for dizziness, syncope, light-headedness and headaches.  Hematological: Negative for adenopathy. Does not bruise/bleed easily.  Psychiatric/Behavioral: Negative for dysphoric mood and sleep  disturbance. The patient is not nervous/anxious.        Objective:   Physical Exam Constitutional:      Appearance: Normal appearance.  HENT:     Right Ear: Tympanic membrane, ear canal and external ear normal.     Left Ear: Tympanic membrane, ear canal and external ear normal.     Mouth/Throat:     Pharynx: No oropharyngeal exudate or posterior oropharyngeal erythema.  Eyes:     Conjunctiva/sclera: Conjunctivae normal.     Pupils: Pupils are equal, round, and reactive to light.  Cardiovascular:     Rate and Rhythm: Normal rate and regular  rhythm.     Pulses: Normal pulses.     Heart sounds: No murmur heard. No gallop.   Pulmonary:     Effort: Pulmonary effort is normal.     Breath sounds: Normal breath sounds. No wheezing or rales.  Abdominal:     Palpations: Abdomen is soft.     Tenderness: There is no abdominal tenderness.  Genitourinary:    Testes: Normal.  Musculoskeletal:     Cervical back: Neck supple.     Right lower leg: No edema.     Left lower leg: No edema.  Lymphadenopathy:     Cervical: No cervical adenopathy.  Skin:    General: Skin is warm.     Findings: No rash.     Comments: Multiple fleshy moles on back--mild irritation  Neurological:     General: No focal deficit present.     Mental Status: He is alert and oriented to person, place, and time.  Psychiatric:        Mood and Affect: Mood normal.        Behavior: Behavior normal.            Assessment & Plan:

## 2022-04-17 ENCOUNTER — Telehealth: Payer: Self-pay | Admitting: Internal Medicine

## 2022-04-17 MED ORDER — VALACYCLOVIR HCL 1 G PO TABS: 2000.0000 mg | ORAL_TABLET | Freq: Every day | ORAL | 0 refills | Status: DC

## 2022-04-17 NOTE — Telephone Encounter (Signed)
  Encourage patient to contact the pharmacy for refills or they can request refills through [facility]  Did the patient contact the pharmacy: No   LAST APPOINTMENT DATE: 10/06/2020  NEXT APPOINTMENT DATE: N/A  MEDICATION: valACYclovir (VALTREX) 1000 MG tablet  Is the patient out of medication? Yes  PHARMACY: [id number], [place] - [address] Let patient know to contact pharmacy at the end of the day to make sure medication is ready.  Please notify patient to allow 48-72 hours to process

## 2022-04-17 NOTE — Addendum Note (Signed)
Addended by: Viviana Simpler I on: 04/17/2022 01:33 PM   Modules accepted: Orders

## 2022-08-26 ENCOUNTER — Other Ambulatory Visit: Payer: Self-pay | Admitting: Internal Medicine

## 2022-08-26 MED ORDER — VALACYCLOVIR HCL 1 G PO TABS: 2000.0000 mg | ORAL_TABLET | Freq: Every day | ORAL | 1 refills | Status: DC

## 2023-01-16 ENCOUNTER — Encounter: Payer: Self-pay | Admitting: Internal Medicine

## 2023-01-16 ENCOUNTER — Ambulatory Visit (INDEPENDENT_AMBULATORY_CARE_PROVIDER_SITE_OTHER): Payer: 59 | Admitting: Internal Medicine

## 2023-01-16 VITALS — BP 100/70 | HR 60 | Temp 97.4°F | Ht 75.25 in | Wt 221.0 lb

## 2023-01-16 DIAGNOSIS — Z0000 Encounter for general adult medical examination without abnormal findings: Secondary | ICD-10-CM | POA: Diagnosis not present

## 2023-01-16 DIAGNOSIS — Z136 Encounter for screening for cardiovascular disorders: Secondary | ICD-10-CM | POA: Diagnosis not present

## 2023-01-16 DIAGNOSIS — Z13220 Encounter for screening for lipoid disorders: Secondary | ICD-10-CM | POA: Diagnosis not present

## 2023-01-16 LAB — LIPID PANEL
Cholesterol: 261 mg/dL — ABNORMAL HIGH (ref 0–200)
HDL: 43.7 mg/dL (ref >39.00)
LDL Cholesterol: 194 mg/dL — ABNORMAL HIGH (ref 0–99)
NonHDL: 217.76
Total CHOL/HDL Ratio: 6
Triglycerides: 117 mg/dL (ref 0.0–149.0)
VLDL: 23.4 mg/dL (ref 0.0–40.0)

## 2023-01-16 LAB — GLUCOSE, RANDOM: Glucose, Bld: 98 mg/dL (ref 70–99)

## 2023-01-16 NOTE — Progress Notes (Signed)
Subjective:    Patient ID: Greg Wong, male    DOB: 29-Mar-1981, 42 y.o.   MRN: [id number]  HPI Here for physical  Same job Doing well Works out regularly No new concerns  Current Outpatient Medications on File Prior to Visit  Medication Sig Dispense Refill   valACYclovir (VALTREX) 1000 MG tablet Take 2 tablets (2,000 mg total) by mouth daily. Then repeat 12 hours later for cold sores. 28 tablet 1   No current facility-administered medications on file prior to visit.    Allergies  Allergen Reactions   Penicillins     History reviewed. No pertinent past medical history.  Past Surgical History:  Procedure Laterality Date   XI ROBOTIC LAPAROSCOPIC ASSISTED APPENDECTOMY N/A 03/13/2020   Procedure: XI ROBOTIC LAPAROSCOPIC ASSISTED APPENDECTOMY;  Surgeon: Carolan Shiver, MD;  Location: [facility] OR[facility];  Service: General;  Laterality: N/A;    Family History  Problem Relation Age of Onset   CAD Neg Hx    Diabetes Neg Hx    Hypertension Neg Hx    Cancer Neg Hx        no colon or protate cancer    Social History   Socioeconomic History   Marital status: Married    Spouse name: Not on file   Number of children: 2   Years of education: Not on file   Highest education level: Not on file  Occupational History   Occupation: Deputy in [employer]  Tobacco Use   Smoking status: Former    Current packs/day: 0.25    Average packs/day: 0.3 packs/day for 5.0 years (1.3 ttl pk-yrs)    Types: Cigarettes    Passive exposure: Past   Smokeless tobacco: Never  Substance and Sexual Activity   Alcohol use: Yes    Alcohol/week: 0.0 standard drinks of alcohol   Drug use: No   Sexual activity: Yes  Other Topics Concern   Not on file  Social History Narrative   Has 2 children from prior relationship. Shared custody   Social Determinants of Health   Financial Resource Strain: Not on file  Food Insecurity: Not on file  Transportation Needs: Not on file  Physical  Activity: Not on file  Stress: Not on file  Social Connections: Not on file  Intimate Partner Violence: Not on file   Review of Systems  Constitutional:  Negative for fatigue and unexpected weight change.       Wears seat belt  HENT:  Positive for tinnitus. Negative for dental problem, hearing loss and trouble swallowing.        Feels lump on inside of right nare--no bleeding Keeps up with dentist  Eyes:  Negative for visual disturbance.       No diplopia or unilateral vision loss  Respiratory:  Negative for cough, chest tightness and shortness of breath.   Cardiovascular:  Negative for chest pain, palpitations and leg swelling.  Gastrointestinal:  Negative for blood in stool and constipation.       Intermittent heartburn--tums helps   Endocrine: Negative for polydipsia and polyuria.  Genitourinary:  Negative for difficulty urinating and urgency.       No sexual problems  Musculoskeletal:  Negative for arthralgias, back pain and joint swelling.  Skin:  Negative for rash.       No suspicious lesions  Allergic/Immunologic: Negative for environmental allergies and immunocompromised state.  Neurological:  Negative for dizziness, syncope, light-headedness and headaches.  Hematological:  Negative for adenopathy. Does not bruise/bleed easily.  Psychiatric/Behavioral:  Negative for dysphoric mood and sleep disturbance. The patient is not nervous/anxious.        Objective:   Physical Exam Constitutional:      Appearance: Normal appearance.  HENT:     Mouth/Throat:     Pharynx: No oropharyngeal exudate or posterior oropharyngeal erythema.  Eyes:     Conjunctiva/sclera: Conjunctivae normal.     Pupils: Pupils are equal, round, and reactive to light.  Cardiovascular:     Rate and Rhythm: Normal rate and regular rhythm.     Pulses: Normal pulses.     Heart sounds: No murmur heard.    No gallop.  Pulmonary:     Effort: Pulmonary effort is normal.     Breath sounds: Normal breath  sounds. No wheezing or rales.  Abdominal:     Palpations: Abdomen is soft.     Tenderness: There is no abdominal tenderness.  Musculoskeletal:     Cervical back: Neck supple.     Right lower leg: No edema.     Left lower leg: No edema.  Lymphadenopathy:     Cervical: No cervical adenopathy.  Skin:    Findings: No lesion or rash.  Neurological:     General: No focal deficit present.     Mental Status: He is alert and oriented to person, place, and time.  Psychiatric:        Mood and Affect: Mood normal.        Behavior: Behavior normal.            Assessment & Plan:

## 2023-01-16 NOTE — Assessment & Plan Note (Signed)
Healthy Stays in shape Prefers no immunizations No cancer screening yet

## 2023-07-18 ENCOUNTER — Other Ambulatory Visit: Payer: Self-pay | Admitting: Internal Medicine

## 2023-07-18 MED ORDER — VALACYCLOVIR HCL 1 G PO TABS: 2000.0000 mg | ORAL_TABLET | Freq: Every day | ORAL | 1 refills | Status: DC

## 2023-07-18 NOTE — Telephone Encounter (Signed)
 Copied from CRM [id number]. Topic: Clinical - Medication Refill >> Jul 18, 2023 10:39 AM Corean SAUNDERS wrote: Most Recent Primary Care Visit:  Provider: JIMMY ADE I  Department: [facility]  Visit Type: PHYSICAL  Date: 01/16/2023  Medication: valACYclovir  (VALTREX ) 1000 MG tablet   Has the patient contacted their pharmacy? Patient is about to use his last re-fiill and would like to request this now. (Agent: If no, request that the patient contact the pharmacy for the refill. If patient does not wish to contact the pharmacy document the reason why and proceed with request.) (Agent: If yes, when and what did the pharmacy advise?)  Is this the correct pharmacy for this prescription? Yes If no, delete pharmacy and type the correct one.  This is the patient's preferred pharmacy:  [phone] [place] - [address] [place] [address] Phone: [phone] Fax: [phone]    Has the prescription been filled recently? Yes  Is the patient out of the medication? Yes  Has the patient been seen for an appointment in the last year OR does the patient have an upcoming appointment? Yes  Can we respond through MyChart? No  Agent: Please be advised that Rx refills may take up to 3 business days. We ask that you follow-up with your pharmacy.

## 2023-07-18 NOTE — Telephone Encounter (Signed)
 Patient last seen in office 01/16/23. Medication last prescribed 08/26/22. Patient reports he is filling his last refill now and would like to request more preemptively.

## 2023-08-06 ENCOUNTER — Other Ambulatory Visit: Payer: Self-pay | Admitting: Internal Medicine

## 2024-03-23 ENCOUNTER — Ambulatory Visit: Admitting: General Practice

## 2024-03-23 ENCOUNTER — Ambulatory Visit: Payer: Self-pay | Admitting: General Practice

## 2024-03-23 ENCOUNTER — Encounter: Payer: Self-pay | Admitting: General Practice

## 2024-03-23 VITALS — BP 118/80 | HR 65 | Temp 98.3°F | Ht 75.25 in | Wt 232.4 lb

## 2024-03-23 DIAGNOSIS — Z1159 Encounter for screening for other viral diseases: Secondary | ICD-10-CM | POA: Diagnosis not present

## 2024-03-23 DIAGNOSIS — Z7689 Persons encountering health services in other specified circumstances: Secondary | ICD-10-CM

## 2024-03-23 DIAGNOSIS — Z0000 Encounter for general adult medical examination without abnormal findings: Secondary | ICD-10-CM | POA: Diagnosis not present

## 2024-03-23 DIAGNOSIS — R7303 Prediabetes: Secondary | ICD-10-CM | POA: Insufficient documentation

## 2024-03-23 DIAGNOSIS — F4310 Post-traumatic stress disorder, unspecified: Secondary | ICD-10-CM | POA: Insufficient documentation

## 2024-03-23 DIAGNOSIS — B001 Herpesviral vesicular dermatitis: Secondary | ICD-10-CM | POA: Diagnosis not present

## 2024-03-23 DIAGNOSIS — E782 Mixed hyperlipidemia: Secondary | ICD-10-CM

## 2024-03-23 DIAGNOSIS — M629 Disorder of muscle, unspecified: Secondary | ICD-10-CM | POA: Insufficient documentation

## 2024-03-23 LAB — LIPID PANEL
Cholesterol: 199 mg/dL (ref 0–200)
HDL: 33.4 mg/dL — ABNORMAL LOW (ref >39.00)
LDL Cholesterol: 131 mg/dL — ABNORMAL HIGH (ref 0–99)
NonHDL: 165.42
Total CHOL/HDL Ratio: 6
Triglycerides: 171 mg/dL — ABNORMAL HIGH (ref 0.0–149.0)
VLDL: 34.2 mg/dL (ref 0.0–40.0)

## 2024-03-23 LAB — CBC
HCT: 45.4 % (ref 39.0–52.0)
Hemoglobin: 15.3 g/dL (ref 13.0–17.0)
MCHC: 33.6 g/dL (ref 30.0–36.0)
MCV: 87.3 fl (ref 78.0–100.0)
Platelets: 193 K/uL (ref 150.0–400.0)
RBC: 5.2 Mil/uL (ref 4.22–5.81)
RDW: 12.7 % (ref 11.5–15.5)
WBC: 4.8 K/uL (ref 4.0–10.5)

## 2024-03-23 LAB — COMPREHENSIVE METABOLIC PANEL WITH GFR
ALT: 19 U/L (ref 0–53)
AST: 19 U/L (ref 0–37)
Albumin: 4.8 g/dL (ref 3.5–5.2)
Alkaline Phosphatase: 69 U/L (ref 39–117)
BUN: 13 mg/dL (ref 6–23)
CO2: 29 meq/L (ref 19–32)
Calcium: 9.5 mg/dL (ref 8.4–10.5)
Chloride: 101 meq/L (ref 96–112)
Creatinine, Ser: 1.38 mg/dL (ref 0.40–1.50)
GFR: 63.35 mL/min (ref >60.00)
Glucose, Bld: 82 mg/dL (ref 70–99)
Potassium: 4.1 meq/L (ref 3.5–5.1)
Sodium: 139 meq/L (ref 135–145)
Total Bilirubin: 0.5 mg/dL (ref 0.2–1.2)
Total Protein: 6.5 g/dL (ref 6.0–8.3)

## 2024-03-23 LAB — TSH: TSH: 2.73 u[IU]/mL (ref 0.35–5.50)

## 2024-03-23 LAB — HEMOGLOBIN A1C: Hgb A1c MFr Bld: 5.7 % (ref 4.6–6.5)

## 2024-03-23 MED ORDER — VALACYCLOVIR HCL 1 G PO TABS: 2000.0000 mg | ORAL_TABLET | Freq: Every day | ORAL | 1 refills | Status: AC

## 2024-03-23 NOTE — Patient Instructions (Addendum)
 Stop by the lab prior to leaving today. I will notify you of your results once received.   It was a pleasure meeting you!

## 2024-03-23 NOTE — Progress Notes (Signed)
 New Patient Office Visit  Subjective    Patient ID: Greg Wong, male    DOB: [birth year]  Age: 44 y.o. MRN: [id number]  CC:  Chief Complaint  Patient presents with   New Patient (Initial Visit)    TOC from [place]   Annual Exam    Needs done for work    HPI Greg Wong is a 42 y.o. male presents to establish care, for complete physical and follow up of chronic conditions. Previous PCP- Dr. Jimmy.   Immunizations: -Tetanus: Completed in 2018 -Influenza: declines  Diet: Fair diet.  Exercise: regular exercise. 4-5 days a week. Weight lifting.   Eye exam: Completed several years ago.  Dental exam: Completes semi-annually     Outpatient Encounter Medications as of 03/23/2024  Medication Sig   [DISCONTINUED] valACYclovir  (VALTREX ) 1000 MG tablet Take 2 tablets (2,000 mg total) by mouth daily. Then repeat 12 hours later for cold sores.   valACYclovir  (VALTREX ) 1000 MG tablet Take 2 tablets (2,000 mg total) by mouth daily. Then repeat 12 hours later for cold sores.   No facility-administered encounter medications on file as of 03/23/2024.    Past Medical History:  Diagnosis Date   Disorder of muscle, ligament, and fascia 03/23/2024   tear intrasubstance right biceps brachia muscle.     Posttraumatic stress disorder 03/23/2024    Past Surgical History:  Procedure Laterality Date   XI ROBOTIC LAPAROSCOPIC ASSISTED APPENDECTOMY N/A 03/13/2020   Procedure: XI ROBOTIC LAPAROSCOPIC ASSISTED APPENDECTOMY;  Surgeon: Rodolph Romano, MD;  Location: [facility] OR[facility];  Service: General;  Laterality: N/A;    Family History  Problem Relation Age of Onset   CAD Neg Hx    Diabetes Neg Hx    Hypertension Neg Hx    Cancer Neg Hx        no colon or protate cancer    Social History   Socioeconomic History   Marital status: Married    Spouse name: Not on file   Number of children: 2   Years of education: Not on file   Highest education level: Not on file  Occupational  History   Occupation: Deputy in [place]  Tobacco Use   Smoking status: Former    Current packs/day: 0.25    Average packs/day: 0.3 packs/day for 5.0 years (1.3 ttl pk-yrs)    Types: Cigarettes    Passive exposure: Past   Smokeless tobacco: Never  Substance and Sexual Activity   Alcohol use: Yes    Alcohol/week: 0.0 standard drinks of alcohol   Drug use: No   Sexual activity: Yes  Other Topics Concern   Not on file  Social History Narrative   Has 2 children from prior relationship. Shared custody   Social Drivers of Corporate investment banker Strain: Not on file  Food Insecurity: Not on file  Transportation Needs: Not on file  Physical Activity: Not on file  Stress: Not on file  Social Connections: Not on file  Intimate Partner Violence: Not on file    Review of Systems  Constitutional:  Negative for chills, fever, malaise/fatigue and weight loss.  HENT:  Negative for congestion, ear discharge, ear pain, hearing loss, nosebleeds, sinus pain, sore throat and tinnitus.   Eyes:  Negative for blurred vision, double vision, pain, discharge and redness.  Respiratory:  Negative for cough, shortness of breath, wheezing and stridor.   Cardiovascular:  Negative for chest pain, palpitations and leg swelling.  Gastrointestinal:  Negative for abdominal pain,  constipation, diarrhea, heartburn, nausea and vomiting.  Genitourinary:  Negative for dysuria, frequency and urgency.  Musculoskeletal:  Negative for myalgias.  Skin:  Negative for rash.  Neurological:  Negative for dizziness, tingling, seizures, weakness and headaches.  Psychiatric/Behavioral:  Negative for depression, substance abuse and suicidal ideas. The patient is not nervous/anxious.         Objective    BP 118/80   Pulse 65   Temp 98.3 F (36.8 C) (Oral)   Ht 6' 3.25 (1.911 m)   Wt 232 lb 6.4 oz (105.4 kg)   SpO2 96%   BMI 28.86 kg/m   Physical Exam Vitals and nursing note reviewed.   Constitutional:      Appearance: Normal appearance.  HENT:     Head: Normocephalic and atraumatic.     Right Ear: Tympanic membrane, ear canal and external ear normal.     Left Ear: Tympanic membrane, ear canal and external ear normal.     Nose: Nose normal.     Mouth/Throat:     Mouth: Mucous membranes are moist.     Pharynx: Oropharynx is clear.  Eyes:     Conjunctiva/sclera: Conjunctivae normal.     Pupils: Pupils are equal, round, and reactive to light.  Cardiovascular:     Rate and Rhythm: Normal rate and regular rhythm.     Pulses: Normal pulses.     Heart sounds: Normal heart sounds.  Pulmonary:     Effort: Pulmonary effort is normal.     Breath sounds: Normal breath sounds.  Abdominal:     General: Abdomen is flat. Bowel sounds are normal.     Palpations: Abdomen is soft.  Musculoskeletal:        General: Normal range of motion.     Cervical back: Normal range of motion.  Skin:    General: Skin is warm and dry.     Capillary Refill: Capillary refill takes less than 2 seconds.  Neurological:     General: No focal deficit present.     Mental Status: He is alert and oriented to person, place, and time. Mental status is at baseline.  Psychiatric:        Mood and Affect: Mood normal.        Behavior: Behavior normal.        Thought Content: Thought content normal.        Judgment: Judgment normal.          Assessment & Plan:  Encounter for screening and preventative care Assessment & Plan: Immunizations Tdap- UTD; declines influenza.   Discussed the importance of a healthy diet and regular exercise in order for weight loss, and to reduce the risk of further co-morbidity.  Exam stable. Labs pending.  Follow up in 1 year for repeat physical.   Orders: -     CBC -     Comprehensive metabolic panel with GFR -     Lipid panel -     TSH -     Hemoglobin A1c  Need for hepatitis C screening test -     Hepatitis C antibody  Mixed hyperlipidemia Assessment  & Plan: Lipid panel pending.   Recurrent cold sores Assessment & Plan: No recent flares.  Refill sent.  Orders: -     valACYclovir  HCl; Take 2 tablets (2,000 mg total) by mouth daily. Then repeat 12 hours later for cold sores.  Dispense: 28 tablet; Refill: 1  Transfer requested for continuity of care Assessment & Plan: EMR reviewed  briefly.       Return in about 1 year (around 03/23/2025) for physical.   Carrol Aurora, NP

## 2024-03-23 NOTE — Assessment & Plan Note (Signed)
 No recent flares.  Refill sent.

## 2024-03-23 NOTE — Assessment & Plan Note (Signed)
 Lipid panel pending.

## 2024-03-23 NOTE — Assessment & Plan Note (Signed)
 Immunizations Tdap- UTD; declines influenza.   Discussed the importance of a healthy diet and regular exercise in order for weight loss, and to reduce the risk of further co-morbidity.  Exam stable. Labs pending.  Follow up in 1 year for repeat physical.

## 2024-03-23 NOTE — Assessment & Plan Note (Signed)
 EMR reviewed briefly.

## 2024-03-24 LAB — HEPATITIS C ANTIBODY: Hepatitis C Ab: NONREACTIVE
